# Patient Record
Sex: Male | Born: 1995
Health system: Southern US, Community
[De-identification: ages and names within clinical notes are randomized; demographics above are authoritative.]

## PROBLEM LIST (undated history)

## (undated) DIAGNOSIS — S43006A Unspecified dislocation of unspecified shoulder joint, initial encounter: Secondary | ICD-10-CM

---
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
## 2017-12-23 ENCOUNTER — Encounter (HOSPITAL_BASED_OUTPATIENT_CLINIC_OR_DEPARTMENT_OTHER): Payer: Self-pay

## 2017-12-26 ENCOUNTER — Encounter (HOSPITAL_BASED_OUTPATIENT_CLINIC_OR_DEPARTMENT_OTHER): Payer: Self-pay

## 2017-12-26 ENCOUNTER — Other Ambulatory Visit: Payer: Self-pay

## 2017-12-26 NOTE — Progress Notes (Signed)
Spoke with Dr. Aundria Rud via telephone he will put orders in today.

## 2017-12-26 NOTE — Progress Notes (Signed)
Spoke with:  Darroll NPO:  After Midnight, no gum, candy, or mints   Arrival time: 1030AM Labs: N/A AM medications:  None Pre op orders: No, LMOM for [facility] 12/26/2017 at 1605 Ride home:  Elayne Guerin (dad) [phone]

## 2017-12-27 ENCOUNTER — Encounter (HOSPITAL_BASED_OUTPATIENT_CLINIC_OR_DEPARTMENT_OTHER): Payer: Self-pay

## 2017-12-27 ENCOUNTER — Ambulatory Visit (HOSPITAL_BASED_OUTPATIENT_CLINIC_OR_DEPARTMENT_OTHER): Payer: Medicaid Other | Admitting: Certified Registered Nurse Anesthetist

## 2017-12-27 ENCOUNTER — Ambulatory Visit (HOSPITAL_BASED_OUTPATIENT_CLINIC_OR_DEPARTMENT_OTHER)
Admission: RE | Admit: 2017-12-27 | Discharge: 2017-12-27 | Disposition: A | Discharge status: Home / Self Care | Payer: Medicaid Other | Source: Ambulatory Visit | Attending: Orthopedic Surgery | Admitting: Orthopedic Surgery

## 2017-12-27 ENCOUNTER — Encounter (HOSPITAL_BASED_OUTPATIENT_CLINIC_OR_DEPARTMENT_OTHER)
Admission: RE | Disposition: A | Discharge status: Home / Self Care | Payer: Self-pay | Source: Ambulatory Visit | Attending: Orthopedic Surgery

## 2017-12-27 DIAGNOSIS — M25311 Other instability, right shoulder: Secondary | ICD-10-CM | POA: Diagnosis present

## 2017-12-27 HISTORY — PX: SHOULDER ARTHROSCOPY WITH LABRAL REPAIR: SHX5691

## 2017-12-27 HISTORY — DX: Unspecified dislocation of unspecified shoulder joint, initial encounter: S43.006A

## 2017-12-27 SURGERY — ARTHROSCOPY, SHOULDER, WITH GLENOID LABRUM REPAIR
Anesthesia: General | Laterality: Right

## 2017-12-27 MED ORDER — CEFAZOLIN SODIUM-DEXTROSE 2-4 GM/100ML-% IV SOLN
INTRAVENOUS | Status: AC
  Filled 2017-12-27: qty 100

## 2017-12-27 MED ORDER — DEXAMETHASONE SODIUM PHOSPHATE 4 MG/ML IJ SOLN
INTRAMUSCULAR | Status: DC | PRN
  Administered 2017-12-27: 4 mg via INTRAVENOUS

## 2017-12-27 MED ORDER — ROPIVACAINE HCL 7.5 MG/ML IJ SOLN
INTRAMUSCULAR | Status: DC | PRN
  Administered 2017-12-27: 20 mL via PERINEURAL

## 2017-12-27 MED ORDER — PROMETHAZINE HCL 25 MG/ML IJ SOLN
6.2500 mg | INTRAMUSCULAR | Status: DC | PRN
  Filled 2017-12-27: qty 1

## 2017-12-27 MED ORDER — FENTANYL CITRATE (PF) 100 MCG/2ML IJ SOLN
INTRAMUSCULAR | Status: AC
  Filled 2017-12-27: qty 2

## 2017-12-27 MED ORDER — LIDOCAINE 2% (20 MG/ML) 5 ML SYRINGE
INTRAMUSCULAR | Status: AC
  Filled 2017-12-27: qty 5

## 2017-12-27 MED ORDER — ONDANSETRON HCL 4 MG/2ML IJ SOLN
INTRAMUSCULAR | Status: AC
  Filled 2017-12-27: qty 2

## 2017-12-27 MED ORDER — MIDAZOLAM HCL 2 MG/2ML IJ SOLN
0.5000 mg | Freq: Once | INTRAMUSCULAR | Status: DC | PRN
  Filled 2017-12-27: qty 2

## 2017-12-27 MED ORDER — KETOROLAC TROMETHAMINE 30 MG/ML IJ SOLN
INTRAMUSCULAR | Status: AC
  Filled 2017-12-27: qty 1

## 2017-12-27 MED ORDER — CHLORHEXIDINE GLUCONATE 4 % EX LIQD
60.0000 mL | Freq: Once | CUTANEOUS | Status: DC
  Filled 2017-12-27: qty 118

## 2017-12-27 MED ORDER — ONDANSETRON 4 MG PO TBDP: 4.0000 mg | ORAL_TABLET | Freq: Three times a day (TID) | ORAL | 0 refills | Status: DC | PRN

## 2017-12-27 MED ORDER — MIDAZOLAM HCL 2 MG/2ML IJ SOLN
INTRAMUSCULAR | Status: AC
  Filled 2017-12-27: qty 2

## 2017-12-27 MED ORDER — PROPOFOL 10 MG/ML IV BOLUS
INTRAVENOUS | Status: DC | PRN
  Administered 2017-12-27: 200 mg via INTRAVENOUS

## 2017-12-27 MED ORDER — DEXAMETHASONE SODIUM PHOSPHATE 10 MG/ML IJ SOLN
INTRAMUSCULAR | Status: AC
  Filled 2017-12-27: qty 1

## 2017-12-27 MED ORDER — HYDROMORPHONE HCL 1 MG/ML IJ SOLN
0.2500 mg | INTRAMUSCULAR | Status: DC | PRN
  Filled 2017-12-27: qty 0.5

## 2017-12-27 MED ORDER — CEFAZOLIN SODIUM-DEXTROSE 2-4 GM/100ML-% IV SOLN
2.0000 g | INTRAVENOUS | Status: AC
  Administered 2017-12-27: 2 g via INTRAVENOUS
  Filled 2017-12-27: qty 100

## 2017-12-27 MED ORDER — MEPERIDINE HCL 25 MG/ML IJ SOLN
6.2500 mg | INTRAMUSCULAR | Status: DC | PRN
  Filled 2017-12-27: qty 1

## 2017-12-27 MED ORDER — ONDANSETRON HCL 4 MG/2ML IJ SOLN
INTRAMUSCULAR | Status: DC | PRN
  Administered 2017-12-27: 4 mg via INTRAVENOUS

## 2017-12-27 MED ORDER — PROPOFOL 10 MG/ML IV BOLUS
INTRAVENOUS | Status: AC
  Filled 2017-12-27: qty 20

## 2017-12-27 MED ORDER — OXYCODONE HCL 5 MG PO TABS: 5.0000 mg | ORAL_TABLET | ORAL | 0 refills | Status: AC | PRN

## 2017-12-27 MED ORDER — FENTANYL CITRATE (PF) 100 MCG/2ML IJ SOLN
100.0000 ug | Freq: Once | INTRAMUSCULAR | Status: AC
Start: 2017-12-27 — End: 2017-12-27
  Administered 2017-12-27: 100 ug via INTRAVENOUS
  Filled 2017-12-27: qty 2

## 2017-12-27 MED ORDER — LIDOCAINE HCL (CARDIAC) PF 100 MG/5ML IV SOSY
PREFILLED_SYRINGE | INTRAVENOUS | Status: DC | PRN
  Administered 2017-12-27: 60 mg via INTRAVENOUS

## 2017-12-27 MED ORDER — LACTATED RINGERS IV SOLN
INTRAVENOUS | Status: DC
  Administered 2017-12-27: 14:00:00 via INTRAVENOUS
  Administered 2017-12-27: 1000 mL via INTRAVENOUS
  Filled 2017-12-27: qty 1000

## 2017-12-27 MED ORDER — MIDAZOLAM HCL 2 MG/2ML IJ SOLN
2.0000 mg | Freq: Once | INTRAMUSCULAR | Status: AC
  Administered 2017-12-27: 2 mg via INTRAVENOUS
  Filled 2017-12-27: qty 2

## 2017-12-27 SURGICAL SUPPLY — 105 items
45 curve left quickpass lasso ×2 IMPLANT
45 degree right quickpass lasso ×2 IMPLANT
ANCH SUT SHRT 12.5 CANN EYLT (Anchor) ×4 IMPLANT
ANCHOR SUT BIOCOMP LK 2.9X12.5 (Anchor) ×8 IMPLANT
BLADE CUDA GRT WHITE 3.5 (BLADE) IMPLANT
BLADE CUDA SHAVER 3.5 (BLADE) ×1 IMPLANT
BLADE CUTTER GATOR 3.5 (BLADE) IMPLANT
BLADE GREAT WHITE 4.2 (BLADE) IMPLANT
BLADE GREAT WHITE 4.2MM (BLADE)
BLADE SURG 11 STRL SS (BLADE) ×3 IMPLANT
BLADE SURG 15 STRL LF DISP TIS (BLADE) ×1 IMPLANT
BLADE SURG 15 STRL SS (BLADE) ×3
BUR OVAL 4.0 (BURR) IMPLANT
BUR OVAL 6.0 (BURR) IMPLANT
BUR VERTEX HOODED 4.5 (BURR) IMPLANT
CANNULA 5.75X7 CRYSTAL CLEAR (CANNULA) ×3 IMPLANT
CANNULA 5.75X71 LONG (CANNULA) IMPLANT
CANNULA TWIST IN 8.25X7CM (CANNULA) ×2 IMPLANT
COVER MAYO STAND STRL (DRAPES) ×3 IMPLANT
DRAPE LG THREE QUARTER DISP (DRAPES) ×3 IMPLANT
DRAPE ORTHO SPLIT 77X108 STRL (DRAPES) ×6
DRAPE POUCH INSTRU U-SHP 10X18 (DRAPES) ×3 IMPLANT
DRAPE STERI 35X30 U-POUCH (DRAPES) ×3 IMPLANT
DRAPE SURG 17X23 STRL (DRAPES) ×3 IMPLANT
DRAPE SURG ORHT 6 SPLT 77X108 (DRAPES) ×2 IMPLANT
DRAPE U-SHAPE 47X51 STRL (DRAPES) ×3 IMPLANT
DRSG PAD ABDOMINAL 8X10 ST (GAUZE/BANDAGES/DRESSINGS) ×3 IMPLANT
DURAPREP 26ML APPLICATOR (WOUND CARE) ×3 IMPLANT
ELECT MENISCUS 165MM 90D (ELECTRODE) IMPLANT
ELECT REM PT RETURN 9FT ADLT (ELECTROSURGICAL)
ELECTRODE REM PT RTRN 9FT ADLT (ELECTROSURGICAL) IMPLANT
FIBERSTICK 2 (SUTURE) IMPLANT
GAUZE SPONGE 4X4 12PLY STRL (GAUZE/BANDAGES/DRESSINGS) ×3 IMPLANT
GAUZE SPONGE 4X4 12PLY STRL LF (GAUZE/BANDAGES/DRESSINGS) ×2 IMPLANT
GAUZE XEROFORM 1X8 LF (GAUZE/BANDAGES/DRESSINGS) ×3 IMPLANT
GLOVE BIO SURGEON STRL SZ7.5 (GLOVE) ×3 IMPLANT
GLOVE BIOGEL PI IND STRL 8 (GLOVE) ×1 IMPLANT
GLOVE BIOGEL PI INDICATOR 8 (GLOVE) ×2
GOWN STRL REUS W/TWL LRG LVL3 (GOWN DISPOSABLE) ×3 IMPLANT
IV NS IRRIG 3000ML ARTHROMATIC (IV SOLUTION) ×14 IMPLANT
KIT INSERTION 2.9 PUSHLOCK (KITS) ×2 IMPLANT
KIT TURNOVER CYSTO (KITS) ×3 IMPLANT
LASSO 45 DEG LEFT QUICKPASS (SUTURE) ×2 IMPLANT
LASSO QUICK PASS 45DEG CVD RT (SUTURE) ×2 IMPLANT
LASSO SUT 90 DEGREE (SUTURE) IMPLANT
LOOP 2 FIBERLINK CLOSED (SUTURE) IMPLANT
MANIFOLD NEPTUNE II (INSTRUMENTS) ×3 IMPLANT
NDL 1/2 CIR CATGUT .05X1.09 (NEEDLE) IMPLANT
NDL FILTER BLUNT 18X1 1/2 (NEEDLE) ×1 IMPLANT
NDL SAFETY ECLIPSE 18X1.5 (NEEDLE) ×1 IMPLANT
NDL SCORPION MULTI FIRE (NEEDLE) IMPLANT
NEEDLE 1/2 CIR CATGUT .05X1.09 (NEEDLE) IMPLANT
NEEDLE FILTER BLUNT 18X 1/2SAF (NEEDLE)
NEEDLE FILTER BLUNT 18X1 1/2 (NEEDLE) IMPLANT
NEEDLE HYPO 18GX1.5 SHARP (NEEDLE) ×3
NEEDLE SCORPION MULTI FIRE (NEEDLE) IMPLANT
NS IRRIG 500ML POUR BTL (IV SOLUTION) IMPLANT
PACK ARTHROSCOPY DSU (CUSTOM PROCEDURE TRAY) ×3 IMPLANT
PACK BASIN DAY SURGERY FS (CUSTOM PROCEDURE TRAY) ×3 IMPLANT
PAD ABD 8X10 STRL (GAUZE/BANDAGES/DRESSINGS) ×2 IMPLANT
PAD ARMBOARD 7.5X6 YLW CONV (MISCELLANEOUS) ×2 IMPLANT
PENCIL BUTTON HOLSTER BLD 10FT (ELECTRODE) IMPLANT
PORT APPOLLO RF 90DEGREE MULTI (SURGICAL WAND) ×2 IMPLANT
PROBE BIPOLAR ATHRO 135MM 90D (MISCELLANEOUS) ×1 IMPLANT
SET ARTHROSCOPY TUBING (MISCELLANEOUS) ×3
SET ARTHROSCOPY TUBING PVC (MISCELLANEOUS) ×1 IMPLANT
SHAVER 4.2 MM LANZA 9391A (BLADE) IMPLANT
SLEEVE ARM SUSPENSION SYSTEM (MISCELLANEOUS) ×3 IMPLANT
SLING S3 LATERAL DISP (MISCELLANEOUS) ×1 IMPLANT
SLING ULTRA II AB L (ORTHOPEDIC SUPPLIES) IMPLANT
SLING ULTRA II AB S (ORTHOPEDIC SUPPLIES) IMPLANT
SLING ULTRA II L (ORTHOPEDIC SUPPLIES) ×2 IMPLANT
SPONGE LAP 4X18 X RAY DECT (DISPOSABLE) IMPLANT
SUCTION FRAZIER HANDLE 10FR (MISCELLANEOUS)
SUCTION TUBE FRAZIER 10FR DISP (MISCELLANEOUS) IMPLANT
SUT 2 FIBERLOOP 20 STRT BLUE (SUTURE)
SUT ETHILON 3 0 PS 1 (SUTURE) IMPLANT
SUT FIBERWIRE #2 38 T-5 BLUE (SUTURE)
SUT LASSO 45 DEGREE LEFT (SUTURE) IMPLANT
SUT LASSO 45D RIGHT (SUTURE) IMPLANT
SUT MNCRL AB 3-0 PS2 18 (SUTURE) ×2 IMPLANT
SUT MNCRL AB 3-0 PS2 27 (SUTURE) ×3 IMPLANT
SUT PDS AB 0 CT1 36 (SUTURE) IMPLANT
SUT TIGER TAPE 7 IN WHITE (SUTURE) IMPLANT
SUT VIC AB 0 CT1 36 (SUTURE) IMPLANT
SUT VIC AB 2-0 CT1 27 (SUTURE)
SUT VIC AB 2-0 CT1 TAPERPNT 27 (SUTURE) IMPLANT
SUTURE 2 FIBERLOOP 20 STRT BLU (SUTURE) IMPLANT
SUTURE FIBERWR #2 38 T-5 BLUE (SUTURE) IMPLANT
SUTURE TAPE TIGERLINK 1.3MM BL (SUTURE) IMPLANT
SUTURETAPE TIGERLINK 1.3MM BL (SUTURE) ×3
SYR CONTROL 10ML LL (SYRINGE) IMPLANT
SYR TB 1ML LL NO SAFETY (SYRINGE) ×1 IMPLANT
TAPE CLOTH SURG 4X10 WHT LF (GAUZE/BANDAGES/DRESSINGS) ×2 IMPLANT
TAPE CLOTH SURG 6X10 WHT LF (GAUZE/BANDAGES/DRESSINGS) ×3 IMPLANT
TAPE STRIPS DRAPE STRL (GAUZE/BANDAGES/DRESSINGS) ×2 IMPLANT
TOWEL OR 17X24 6PK STRL BLUE (TOWEL DISPOSABLE) ×3 IMPLANT
TUBE CONNECTING 12'X1/4 (SUCTIONS) ×3
TUBE CONNECTING 12X1/4 (SUCTIONS) ×5 IMPLANT
TUBING ARTHROSCOPY IRRIG 16FT (MISCELLANEOUS) ×2 IMPLANT
WATER STERILE IRR 500ML POUR (IV SOLUTION) ×3 IMPLANT
YANKAUER SUCT BULB TIP NO VENT (SUCTIONS) IMPLANT
arthroscopy pump tubing ×2 IMPLANT
perc insertion kit 2.9 pushlock ×2 IMPLANT
tigerlink 1.3 m suture tape ×8 IMPLANT

## 2017-12-27 NOTE — H&P (Signed)
ORTHOPAEDIC H and P  REQUESTING PHYSICIAN: Yolonda Kida, MD  PCP:  System, Pcp Not In  Chief Complaint: Right shoulder recurrent instability.  HPI: Keith Rhodes is a 22 y.o. male who complains of right shoulder instability and pain.  He has had multiple dislocations over the past year and a half.  He presents today for arthroscopic stabilization.  We have counseled him as to the indications for that surgery as he has failed nonoperative management.  He has no new complaints at this time other than being ready to have the shoulder fixed.  Past Medical History:  Diagnosis Date  . Shoulder dislocation    Rightx4   History reviewed. No pertinent surgical history. Social History   Socioeconomic History  . Marital status: Single    Spouse name: Not on file  . Number of children: Not on file  . Years of education: Not on file  . Highest education level: Not on file  Occupational History  . Not on file  Social Needs  . Financial resource strain: Not on file  . Food insecurity:    Worry: Not on file    Inability: Not on file  . Transportation needs:    Medical: Not on file    Non-medical: Not on file  Tobacco Use  . Smoking status: Never Smoker  . Smokeless tobacco: Never Used  Substance and Sexual Activity  . Alcohol use: No  . Drug use: Never  . Sexual activity: Not on file  Lifestyle  . Physical activity:    Days per week: Not on file    Minutes per session: Not on file  . Stress: Not on file  Relationships  . Social connections:    Talks on phone: Not on file    Gets together: Not on file    Attends religious service: Not on file    Active member of club or organization: Not on file    Attends meetings of clubs or organizations: Not on file    Relationship status: Not on file  Other Topics Concern  . Not on file  Social History Narrative  . Not on file   History reviewed. No pertinent family history. No Known Allergies Prior to Admission  medications   Medication Sig Start Date End Date Taking? Authorizing Provider  acetaminophen (TYLENOL) 325 MG tablet Take 650 mg by mouth every 6 (six) hours as needed for mild pain.   Yes [provider]  OVER THE COUNTER MEDICATION Bone strength vitamins   Yes [provider]   No results found.  Positive ROS: All other systems have been reviewed and were otherwise negative with the exception of those mentioned in the HPI and as above.  Physical Exam: General: Alert, no acute distress Cardiovascular: No pedal edema Respiratory: No cyanosis, no use of accessory musculature GI: No organomegaly, abdomen is soft and non-tender Skin: No lesions in the area of chief complaint Neurologic: Sensation intact distally Psychiatric: Patient is competent for consent with normal mood and affect Lymphatic: No axillary or cervical lymphadenopathy    Assessment: Right shoulder instability with Bankart tear.  Plan: -Plan for arthroscopic stabilization with labral repair and possible SLAP repair.  We again reviewed the risks, benefits, and indications of that procedure at length.  He has provided informed consent. -We will plan for discharge home from PACU postoperatively.  He will have an interscalene block provided by the anesthesia team preop. -He will be in a shoulder sling with abduction pillow  postop for 1 month.    Yolonda Kida, MD Cell [phone]    12/27/2017 11:59 AM

## 2017-12-27 NOTE — Op Note (Signed)
12/27/2017   PATIENT:  Keith Rhodes    PRE-OPERATIVE DIAGNOSIS:  Right shoulder instability  POST-OPERATIVE DIAGNOSIS:  Same  PROCEDURE:  Arthroscopic right shoulder labrum repair (Bankart)  SURGEON:  Yolonda Kida, MD  Assistant:  Hillis Range, RNFA  ANESTHESIA:   General  ESTIMATED BLOOD LOSS: minimal  PREOPERATIVE INDICATIONS:  Keith Rhodes is a  22 y.o. male with a diagnosis of Right shoulder instability who failed conservative measures and elected for surgical management.  Prior to today's surgical intervention he has had no less than 6 anterior dislocations that required emergency department reductions.  The risks benefits and alternatives were discussed with the patient preoperatively including but not limited to the risks of infection, bleeding, nerve injury, cardiopulmonary complications, the need for revision surgery, among others, and the patient was willing to proceed.  OPERATIVE IMPLANTS: Arthrex bio composite 2.9 mm short push lock anchors x4. I used a total of 4 #2 labral fiber tapes,.   OPERATIVE FINDINGS:  On the diagnostic arthroscopy he was noted to have grade 0 chondromalacia of the humeral head and glenoid fossa.  He had a large anterior Bankart with an anterior labral tear that has extended from the 2 o'clock position on this right shoulder all the way to about 7:00.  The capsule was somewhat patulous.  There was a shallow Hill-Sachs lesion that was not engaging on the posterior humeral head.  The biceps tendon was erythematous but there was no SLAP pathology noted.  The rotator cuff was intact 4.  There were no loose bodies.    UNIQUE ASPECTS OF THE CASE:   The inferior anterior labral tissue was very poor in quality.  The capsular quality was fair to good.  OPERATIVE PROCEDURE: The patient was brought to the operating room and placed in the supine position. General anesthesia was administered. IV antibiotics were given. General  anesthesia was administered.   The upper extremity was examined and found to be grossly unstable particularly to anterior testing. There were no limitations in range of motion however.The upper extremity was prepped and draped in the usual sterile fashion. The patient was in a semilateral decubitus position.  Time out was performed. Diagnostic arthroscopy was carried out the above-named findings.   I placed 2 anterior cannulas, one just off the superior boarder of the subscapularis and one in the superolateral aspect of the rotator interval utilizing spinal needle localization, and then mobilized the labrum off of the medial neck of the glenoid with the spatula.  I then prepared the neck of the glenoid with a shaver/rasp to optimize healing, while still preserving the anterior bone stock.  The labrum had excellent mobility.   I began the stabilization by addressing the 7:00 labral nondisplaced tear.  I placed a 7:00 portal percutaneously and utilized the 45 lasso to perform a capsular labral repair at the 7 o'clock position.  A fiber tape was passed to that tissue and placed into a 2.9 mm push lock anchor.  We next turned our attention to the anterior labral repair.  I then used a suture passer to pass an inverted labral fiber tape on either side of the inferior anterior glenohumeral ligament.  Care was taken to perform superior and lateral shift of the capsulolabral tissue to restore the labrum bumper.  This had excellent purchase on the tissue. Each of this was secured into the glenoid by placing a 2.9 mm anchor by first drilling and then securing the anchor with mallet.  One  anchor was placed at 5 o'clock and the second at 4 o'clock on the glenoid face.  I then placed a another suture slightly superiorly.  This was anchored around the 3:00 o'clock position using a push lock.   Excellent soft tissue restoration of tension was achieved, restoring the labrum bumper, and the humeral head was noted to be  centered on the glenoi , and the arthroscopic cannulas were removed, and the portals closed with Monocryl followed by Steri-Strips and sterile gauze. The patient was awakened and returned to the PACU in stable and satisfactory condition. There were no complications and the patient tolerated the procedure well.  All counts were correct.  No complications were noted and the patient was transferred to the PACU in stable condition.  Disposition:  The patient will be nonweightbearing with an abduction sling to the operative extremity.  He may begin scapular retractions and elbow hand and wrist range motion as tolerated.  He will begin physical therapy in 1 week.  I will see them back in the office in 2 weeks for a wound check.

## 2017-12-27 NOTE — Discharge Instructions (Signed)
Ortho Instructions:  - Maintain your sling at all times, unless showering or doing daily pendulum exercise - No lifting with the right arm - you may remove your post op bandages in 3 days and begin showering at that time, then pat your wounds dry and cover with band aids. - for mild to moderate pain use tylenol and or ibuprofen as needed and for severe pain use oxycodone as directed. - return to see Dr. Aundria Rud in 2 weeks.  Call your surgeon if you experience:   1.  Fever over 101.0. 2.  Inability to urinate. 3.  Nausea and/or vomiting. 4.  Extreme swelling or bruising at the surgical site. 5.  Continued bleeding from the incision. 6.  Increased pain, redness or drainage from the incision. 7.  Problems related to your pain medication. 8.  Any problems and/or concerns   Post Anesthesia Home Care Instructions  Activity: Get plenty of rest for the remainder of the day. A responsible individual must stay with you for 24 hours following the procedure.  For the next 24 hours, DO NOT: -Drive a car -Advertising copywriter -Drink alcoholic beverages -Take any medication unless instructed by your physician -Make any legal decisions or sign important papers.  Meals: Start with liquid foods such as gelatin or soup. Progress to regular foods as tolerated. Avoid greasy, spicy, heavy foods. If nausea and/or vomiting occur, drink only clear liquids until the nausea and/or vomiting subsides. Call your physician if vomiting continues.  Special Instructions/Symptoms: Your throat may feel dry or sore from the anesthesia or the breathing tube placed in your throat during surgery. If this causes discomfort, gargle with warm salt water. The discomfort should disappear within 24 hours.

## 2017-12-27 NOTE — Brief Op Note (Signed)
12/27/2017  2:48 PM  PATIENT:  Keith Rhodes  22 y.o. male  PRE-OPERATIVE DIAGNOSIS:  Right shoulder instability  POST-OPERATIVE DIAGNOSIS:  Right shoulder instability  PROCEDURE:  Procedure(s): Arthroscopic right shoulder labrum repair (Right)  SURGEON:  Surgeon(s) and Role:    * Yolonda Kida, MD - Primary  PHYSICIAN ASSISTANT:   ASSISTANTS: Hillis Range, RNFA   ANESTHESIA:   regional and general  EBL:  minimal  BLOOD ADMINISTERED:none  DRAINS: none   LOCAL MEDICATIONS USED:  MARCAINE     SPECIMEN:  No Specimen  DISPOSITION OF SPECIMEN:  N/A  COUNTS:  YES  TOURNIQUET:  * No tourniquets in log *  DICTATION: .Note written in EPIC  PLAN OF CARE: Discharge to home after PACU  PATIENT DISPOSITION:  PACU - hemodynamically stable.   Delay start of Pharmacological VTE agent (>24hrs) due to surgical blood loss or risk of bleeding: not applicable

## 2017-12-27 NOTE — Anesthesia Procedure Notes (Signed)
Anesthesia Regional Block: Interscalene brachial plexus block   Pre-Anesthetic Checklist: ,, timeout performed, Correct Patient, Correct Site, Correct Laterality, Correct Procedure, Correct Position, site marked, Risks and benefits discussed,  Surgical consent,  Pre-op evaluation,  At surgeon's request and post-op pain management  Laterality: Right and Upper  Prep: chloraprep       Needles:   Needle Type: Echogenic Needle     Needle Length: 9cm  Needle Gauge: 21     Additional Needles:   Procedures:, nerve stimulator,,, ultrasound used (permanent image in chart),,,,   Nerve Stimulator or Paresthesia:  Response: hand twitch, 0.4 mA, 0.1 ms,   Additional Responses:   Narrative:  Start time: 12/27/2017 12:15 PM End time: 12/27/2017 12:24 PM Injection made incrementally with aspirations every 5 mL.  Performed by: Personally  Anesthesiologist: Jairo Ben, MD  Additional Notes: Pt identified in Holding room.  Monitors applied. Working IV access confirmed. Sterile prep.  #21ga ECHOgenic PNS to hand twitch at 0.45mA threshold, with US guidance.  20cc 0.75% Ropivacaine injected incrementally after negative test dose.  Patient asymptomatic, VSS, no heme aspirated, tolerated well.  Sandford Craze, MD

## 2017-12-27 NOTE — Anesthesia Procedure Notes (Signed)
Procedure Name: LMA Insertion Date/Time: 12/27/2017 12:47 PM Performed by: Cleda Clarks, CRNA Pre-anesthesia Checklist: Patient identified, Emergency Drugs available, Suction available and Patient being monitored Patient Re-evaluated:Patient Re-evaluated prior to induction Oxygen Delivery Method: Circle system utilized Preoxygenation: Pre-oxygenation with 100% oxygen Induction Type: IV induction Ventilation: Mask ventilation without difficulty LMA: LMA inserted LMA Size: 4.0 Number of attempts: 1 Airway Equipment and Method: Bite block Placement Confirmation: positive ETCO2 Tube secured with: Tape Dental Injury: Teeth and Oropharynx as per pre-operative assessment

## 2017-12-27 NOTE — Transfer of Care (Signed)
Immediate Anesthesia Transfer of Care Note  Patient: Keith Rhodes  Procedure(s) Performed: Arthroscopic right shoulder labrum repair (Right )  Patient Location: PACU  Anesthesia Type:General  Level of Consciousness: awake, alert  and oriented  Airway & Oxygen Therapy: Patient Spontanous Breathing and Patient connected to nasal cannula oxygen  Post-op Assessment: Report given to RN and Post -op Vital signs reviewed and stable  Post vital signs: Reviewed and stable  Last Vitals:  Vitals Value Taken Time  BP 110/65 12/27/2017  3:04 PM  Temp 36.4 C 12/27/2017  3:04 PM  Pulse 66 12/27/2017  3:07 PM  Resp 10 12/27/2017  3:07 PM  SpO2 100 % 12/27/2017  3:07 PM  Vitals shown include unvalidated device data.  Last Pain:  Vitals:   12/27/17 1138  TempSrc:   PainSc: 0-No pain      Patients Stated Pain Goal: 6 (12/27/17 1138)  Complications: No apparent anesthesia complications

## 2017-12-27 NOTE — Anesthesia Preprocedure Evaluation (Addendum)
Anesthesia Evaluation  Patient identified by MRN, date of birth, ID band Patient awake    Reviewed: Allergy & Precautions, NPO status , Patient's Chart, lab work & pertinent test results  History of Anesthesia Complications Negative for: history of anesthetic complications  Airway Mallampati: II  TM Distance: >3 FB Neck ROM: Full    Dental  (+) Dental Advisory Given   Pulmonary neg pulmonary ROS,    breath sounds clear to auscultation       Cardiovascular negative cardio ROS   Rhythm:Regular Rate:Normal     Neuro/Psych negative neurological ROS     GI/Hepatic negative GI ROS, Neg liver ROS,   Endo/Other  negative endocrine ROS  Renal/GU negative Renal ROS     Musculoskeletal   Abdominal   Peds  Hematology negative hematology ROS (+)   Anesthesia Other Findings   Reproductive/Obstetrics                             Anesthesia Physical Anesthesia Plan  ASA: I  Anesthesia Plan: General   Post-op Pain Management:    Induction: Intravenous  PONV Risk Score and Plan: 2 and Ondansetron and Dexamethasone  Airway Management Planned: LMA  Additional Equipment:   Intra-op Plan:   Post-operative Plan: Extubation in OR  Informed Consent: I have reviewed the patients History and Physical, chart, labs and discussed the procedure including the risks, benefits and alternatives for the proposed anesthesia with the patient or authorized representative who has indicated his/her understanding and acceptance.   Dental advisory given  Plan Discussed with: CRNA and Surgeon  Anesthesia Plan Comments: (Plan routine monitors, GA-LMA OK, with interscalene block for post op analgesia)       Anesthesia Quick Evaluation

## 2017-12-28 ENCOUNTER — Encounter (HOSPITAL_BASED_OUTPATIENT_CLINIC_OR_DEPARTMENT_OTHER): Payer: Self-pay | Admitting: Orthopedic Surgery

## 2017-12-28 NOTE — Anesthesia Postprocedure Evaluation (Signed)
Anesthesia Post Note  Patient: Keith Rhodes  Procedure(s) Performed: Arthroscopic right shoulder labrum repair (Right )     Patient location during evaluation: PACU Anesthesia Type: General Level of consciousness: sedated and patient cooperative Pain management: pain level controlled Vital Signs Assessment: post-procedure vital signs reviewed and stable Respiratory status: spontaneous breathing Cardiovascular status: stable Anesthetic complications: no    Last Vitals:  Vitals:   12/27/17 1530 12/27/17 1609  BP: 121/77 125/73  Pulse: 71 75  Resp: 13 14  Temp:  36.6 C  SpO2: 97% 96%    Last Pain:  Vitals:   12/28/17 1416  TempSrc:   PainSc: 2                  Keith Rhodes

## 2018-01-30 ENCOUNTER — Other Ambulatory Visit: Payer: Self-pay

## 2018-01-30 ENCOUNTER — Ambulatory Visit: Payer: Medicaid Other | Attending: Orthopedic Surgery | Admitting: Rehabilitative and Restorative Service Providers"

## 2018-01-30 ENCOUNTER — Encounter: Payer: Self-pay | Admitting: Rehabilitative and Restorative Service Providers"

## 2018-01-30 DIAGNOSIS — G8929 Other chronic pain: Secondary | ICD-10-CM | POA: Diagnosis present

## 2018-01-30 DIAGNOSIS — R293 Abnormal posture: Secondary | ICD-10-CM | POA: Insufficient documentation

## 2018-01-30 DIAGNOSIS — M25319 Other instability, unspecified shoulder: Secondary | ICD-10-CM | POA: Insufficient documentation

## 2018-01-30 DIAGNOSIS — M6281 Muscle weakness (generalized): Secondary | ICD-10-CM | POA: Insufficient documentation

## 2018-01-30 DIAGNOSIS — M25511 Pain in right shoulder: Secondary | ICD-10-CM | POA: Diagnosis present

## 2018-01-30 NOTE — Patient Instructions (Signed)
HEP issued for gentle scapula retraction with sling on; small movement not visible to others. Icing instructions for 2-3x/day 15 minutes; discussed he needs to discontinue using R UE in and out of sling and abide by MD precautions. Pt reported agreement.

## 2018-01-30 NOTE — Therapy (Signed)
[facility] Outpatient Rehabilitation [facility] [address] [place], [place], [address] Phone: [phone]   Fax:  [phone]  Physical Therapy Evaluation  Patient Details  Name: Keith Rhodes MRN: [id number] Date of Birth: 08/22/1995 Referring Provider: Duwayne Heck, MD   Encounter Date: 01/30/2018  PT End of Session - 01/30/18 1750    Visit Number  1    Number of Visits  12    Date for PT Re-Evaluation  03/13/18    Authorization Type  MCD    PT Start Time  0300    PT Stop Time  0337    PT Time Calculation (min)  37 min    Activity Tolerance  Patient tolerated treatment well;No increased pain    Behavior During Therapy  WFL for tasks assessed/performed       Past Medical History:  Diagnosis Date  . Shoulder dislocation    Rightx4    Past Surgical History:  Procedure Laterality Date  . SHOULDER ARTHROSCOPY WITH LABRAL REPAIR Right 12/27/2017   Procedure: Arthroscopic right shoulder labrum repair;  Surgeon: Yolonda Kida, MD;  Location: [facility];  Service: Orthopedics;  Laterality: Right;    There were no vitals filed for this visit.   Subjective Assessment - 01/30/18 1506    Subjective  Dislocated my R shoulder 4x. R labral repair 12/27/17 with hardware replacement. Supposed to wear sling 4-6 weeks. June 25 RTD. Pt has been using R arm to perform below shoulder level activities. MD notes state "Plan for arthroscopic stabilization with labral repair and possible SLAP repair"/Bankart repair. Pt reports he has noticed R shoulder/upper arm swelling but no increased pain.     Pertinent History  Dislocated R shoulder 4x. R labral repair 12/27/17 with hardware replacement. Supposed to wear sling 4-6 weeks. June 25 RTD. Very active in sports.    How long can you sit comfortably?  indeterminate time length; no issues    How long can you stand comfortably?  indeterminate time length; no issues    How long can you walk comfortably?   indeterminate time length; no issues    Patient Stated Goals  play basketball, boxing (would dislocate after basketball)    Currently in Pain?  No/denies    Multiple Pain Sites  No         [facility] PT Assessment - 01/30/18 0001      Assessment   Medical Diagnosis  R labral repair 12/27/17    Referring Provider  Duwayne Heck, MD    Onset Date/Surgical Date  12/27/17    Hand Dominance  Right    Next MD Visit  February 07, 2018    Prior Therapy  n/a      Precautions   Precautions  -- wear arm sling x 1 month postop    Precaution Comments  wear arm sling, can do pendulums, can do anything lower height      Balance Screen   Has the patient fallen in the past 6 months  No      Home Environment   Additional Comments  independent with all; not driving      Prior Function   Level of Independence  Independent      Cognition   Overall Cognitive Status  Within Functional Limits for tasks assessed    Attention  Focused    Memory  Appears intact      Observation/Other Assessments   Observations  -- souched posture; wants to hurry and heal  Skin Integrity  has 4 scars from surgery; 1 scab in each scar      Observation/Other Assessments-Edema    Edema  -- pt has moderate swelling from shoulder to mid humerus      Posture/Postural Control   Posture Comments  rounded shoulders, R scapula depressed; R Upper Trap tighter ; cervical AROM WFL      ROM / Strength   AROM / PROM / Strength  AROM      AROM   Overall AROM   Other (comment)    Overall AROM Comments  seated L ER T3, IR T4, flexion/abduction WNL; R flex 75, abdct 69 measured sitting; supine R IR 29, ER 10  with limitation gently felt with ER      Strength   Overall Strength Comments  L shoulder strength 4+ to 5/5; R unassessed due to restrictions of repair      Palpation   Palpation comment  R shoulder swollen; scapulae asymmetrical, elbow flex/ext WNL                Objective measurements completed on examination:  See above findings.                PT Short Term Goals - 01/30/18 1745      PT SHORT TERM GOAL #1   Title  He will be independent with inital HEP     Baseline  issued at eval    Time  3    Period  Weeks    Status  New      PT SHORT TERM GOAL #2   Title  Pt will perform R shoulder flex to 100 degrees seated.     Baseline  75 degrees    Time  3    Period  Weeks    Status  New      PT SHORT TERM GOAL #3   Title  Pt will be able to perform R shoulder abduction to 100 degrees to assist with showering    Baseline  69 degrees    Time  3    Period  Weeks    Status  New        PT Long Term Goals - 01/30/18 1747      PT LONG TERM GOAL #1   Title  He will be able to demo all HEP inclding advanced stabilization and strength without pain.     Baseline  initial HEP issued at eval    Time  6    Period  Weeks    Status  New      PT LONG TERM GOAL #2   Title  Pt will demo improved R UE strength to >/= 4+/5 to assist with ADLS and iADLS    Baseline  unable to be assessed at time of eval due to precautions and post-surgical state    Time  6    Period  Weeks    Status  New      PT LONG TERM GOAL #3   Title  Pt will be able to play basketball x 3 min once cleared by MD without pain    Baseline  Not playing sports    Time  6    Period  Weeks    Status  New      PT LONG TERM GOAL #4   Title  He will be able to tolerate body weight with plank / getting off floor using R arm without pain or compensation.  Baseline  unable    Time  6    Period  Weeks    Status  New             Plan - 01/30/18 1546    Clinical Impression Statement  Pt presents s/p R Bankart/labral repair 12/27/17. He has been performing pendulum exercises prescribed by MD. He also reports using his UE in and out of the sling. He denies pain. He presents with moderate swelling beginning at shoulder extending into mid-upper arm. Elbow flex/ext WNL. Pt would benefit from PT for shoulder ROM,  strengthening, and activity tolerance to assist with improving activity tolerance as he is active. He returns to MD 02/07/18.     History and Personal Factors relevant to plan of care:  s/p R labral/Bankart repair 12/27/17    Clinical Presentation  Stable    Clinical Presentation due to:  post-surgical pt    Clinical Decision Making  Low    Rehab Potential  Excellent    PT Frequency  2x / week    PT Duration  6 weeks    PT Treatment/Interventions  ADLs/Self Care Home Management;Neuromuscular re-education;Manual techniques;Functional mobility training;Moist Heat;Therapeutic activities;Patient/family education;Taping;Vasopneumatic Device;Therapeutic exercise;Compression bandaging;Electrical Stimulation;Scar mobilization;Passive range of motion    PT Next Visit Plan  Review HEP, see what MD says (if he can perform further activities). Further HEP was not issued due to pt already not abiding by MD precaution.    PT Home Exercise Plan  gentle scapula retraction bil with sling on R UE. Ice 15 min 2-3x/day    Consulted and Agree with Plan of Care  Patient       Patient will benefit from skilled therapeutic intervention in order to improve the following deficits and impairments:  Postural dysfunction, Decreased scar mobility, Decreased mobility, Decreased coordination, Decreased activity tolerance, Decreased endurance, Decreased range of motion, Decreased strength, Hypomobility, Impaired UE functional use, Impaired flexibility, Increased edema, Decreased safety awareness  Visit Diagnosis: Instability of shoulder joint, unspecified laterality - Plan: PT plan of care cert/re-cert  Abnormal posture - Plan: PT plan of care cert/re-cert  Chronic right shoulder pain - Plan: PT plan of care cert/re-cert  Muscle weakness (generalized) - Plan: PT plan of care cert/re-cert     Problem List Patient Active Problem List   Diagnosis Date Noted  . Shoulder joint instability, right 12/27/2017     Thornell Sartorius, PT 01/30/2018, 5:57 PM  [facility] [address] [place], [place], [address] Phone: [phone]   Fax:  [phone]  Name: Keith Rhodes MRN: [id number] Date of Birth: Dec 31, 1995

## 2018-02-10 ENCOUNTER — Encounter

## 2018-02-13 ENCOUNTER — Ambulatory Visit: Payer: Medicaid Other | Attending: Orthopedic Surgery | Admitting: Physical Therapy

## 2018-02-13 ENCOUNTER — Encounter: Payer: Self-pay | Admitting: Physical Therapy

## 2018-02-13 DIAGNOSIS — G8929 Other chronic pain: Secondary | ICD-10-CM

## 2018-02-13 DIAGNOSIS — R293 Abnormal posture: Secondary | ICD-10-CM

## 2018-02-13 DIAGNOSIS — M25511 Pain in right shoulder: Secondary | ICD-10-CM | POA: Insufficient documentation

## 2018-02-13 DIAGNOSIS — M6281 Muscle weakness (generalized): Secondary | ICD-10-CM

## 2018-02-13 DIAGNOSIS — M25319 Other instability, unspecified shoulder: Secondary | ICD-10-CM | POA: Insufficient documentation

## 2018-02-13 NOTE — Therapy (Signed)
[facility] Outpatient Rehabilitation [facility] [address] [place], [place], [address] Phone: [phone]   Fax:  [phone]  Physical Therapy Treatment  Patient Details  Name: Keith Rhodes MRN: [id number] Date of Birth: 1995-09-27 Referring Provider: Duwayne Heck, MD   Encounter Date: 02/13/2018  PT End of Session - 02/13/18 1536    Visit Number  2    Number of Visits  12    Date for PT Re-Evaluation  03/13/18    Authorization Type  MCD    PT Start Time  1523 arr late     PT Stop Time  1558    PT Time Calculation (min)  35 min    Activity Tolerance  Patient tolerated treatment well;No increased pain    Behavior During Therapy  WFL for tasks assessed/performed       Past Medical History:  Diagnosis Date  . Shoulder dislocation    Rightx4    Past Surgical History:  Procedure Laterality Date  . SHOULDER ARTHROSCOPY WITH LABRAL REPAIR Right 12/27/2017   Procedure: Arthroscopic right shoulder labrum repair;  Surgeon: Yolonda Kida, MD;  Location: [facility];  Service: Orthopedics;  Laterality: Right;    There were no vitals filed for this visit.  Subjective Assessment - 02/13/18 1531    Subjective  no pain at rest.  Pt arr late.      Currently in Pain?  No/denies                       [facility] Adult PT Treatment/Exercise - 02/13/18 0001      Shoulder Exercises: Supine   Horizontal ABduction  AAROM;Both;10 reps    External Rotation  AAROM;Right;10 reps    External Rotation Weight (lbs)  at 20 deg abd     Flexion  AAROM;Both;10 reps    Shoulder Flexion Weight (lbs)  2 sets 1 supine 1 standing     ABduction  AAROM;Right;10 reps    Shoulder ABduction Weight (lbs)  cane standing     Other Supine Exercises  chest press x 10 cane     Other Supine Exercises  extension gentle x 10 standing       Shoulder Exercises: Pulleys   Flexion  3 minutes      Cryotherapy   Number Minutes Cryotherapy  8 Minutes    Cryotherapy Location  Shoulder    Type of Cryotherapy  Ice pack      Manual Therapy   Passive ROM  all planes, briefly              PT Education - 02/13/18 1606    Education Details  HEP, protocol    Person(s) Educated  Patient    Methods  Explanation    Comprehension  Verbalized understanding       PT Short Term Goals - 02/13/18 1537      PT SHORT TERM GOAL #1   Title  He will be independent with inital HEP     Status  On-going      PT SHORT TERM GOAL #2   Title  Pt will perform R shoulder flex to 100 degrees seated.     Baseline  90    Status  On-going      PT SHORT TERM GOAL #3   Title  Pt will be able to perform R shoulder abduction to 100 degrees to assist with showering    Baseline  80    Status  On-going  PT Long Term Goals - 01/30/18 1747      PT LONG TERM GOAL #1   Title  He will be able to demo all HEP inclding advanced stabilization and strength without pain.     Baseline  initial HEP issued at eval    Time  6    Period  Weeks    Status  New      PT LONG TERM GOAL #2   Title  Pt will demo improved R UE strength to >/= 4+/5 to assist with ADLS and iADLS    Baseline  unable to be assessed at time of eval due to precautions and post-surgical state    Time  6    Period  Weeks    Status  New      PT LONG TERM GOAL #3   Title  Pt will be able to play basketball x 3 min once cleared by MD without pain    Baseline  Not playing sports    Time  6    Period  Weeks    Status  New      PT LONG TERM GOAL #4   Title  He will be able to tolerate body weight with plank / getting off floor using R arm without pain or compensation.     Baseline  unable    Time  6    Period  Weeks    Status  New            Plan - 02/13/18 1600    Clinical Impression Statement  Was seen today but arrived late.  Gave HEP for AAROM.  He is 6 weeks post and now weaned out of the sling.  Pain is min to none, increased today with exercises.  Avoid aggressive  stretching but he has had no PT up to this point so reinforce HEP.      PT Treatment/Interventions  ADLs/Self Care Home Management;Neuromuscular re-education;Manual techniques;Functional mobility training;Moist Heat;Therapeutic activities;Patient/family education;Taping;Vasopneumatic Device;Therapeutic exercise;Compression bandaging;Electrical Stimulation;Scar mobilization;Passive range of motion    PT Next Visit Plan  check HEP, AAROM use table, wall as well.  pulleys.  try isometrics     PT Home Exercise Plan  ice, pendulum, scap retract, cane for AAROM     Consulted and Agree with Plan of Care  Patient       Patient will benefit from skilled therapeutic intervention in order to improve the following deficits and impairments:  Postural dysfunction, Decreased scar mobility, Decreased mobility, Decreased coordination, Decreased activity tolerance, Decreased endurance, Decreased range of motion, Decreased strength, Hypomobility, Impaired UE functional use, Impaired flexibility, Increased edema, Decreased safety awareness  Visit Diagnosis: Instability of shoulder joint, unspecified laterality  Abnormal posture  Chronic right shoulder pain  Muscle weakness (generalized)     Problem List Patient Active Problem List   Diagnosis Date Noted  . Shoulder joint instability, right 12/27/2017    [name] 02/13/2018, 4:07 PM  [facility] [address] [place], [place], [address] Phone: [phone]   Fax:  [phone]  Name: Keith Rhodes MRN: [id number] Date of Birth: [birth year]  Karie MainlandJennifer Kaneshia Cater, PT 02/13/18 4:07 PM Phone: [phone] Fax: [phone]

## 2018-02-13 NOTE — Patient Instructions (Signed)
Access Code: [email or web address]  URL: [email or web address]  Date: 02/13/2018  Prepared by: Karie MainlandJennifer Violeta Lecount   Exercises  Supine Shoulder External Rotation with Dowel - 10 reps - 2 sets - 10 hold - 2x daily - 7x weekly  Supine Shoulder Flexion with Dowel - 10 reps - 2 sets - 10 hold - 2x daily - 7x weekly  Standing Shoulder Extension with Dowel - 10 reps - 2 sets - 10 hold - 2x daily - 7x weekly  Supine Shoulder Press with Dowel - 10 reps - 2 sets - 10 hold - 2x daily - 7x weekly

## 2018-02-14 ENCOUNTER — Ambulatory Visit: Payer: Medicaid Other | Admitting: Physical Therapy

## 2018-02-14 ENCOUNTER — Encounter: Payer: Self-pay | Admitting: Physical Therapy

## 2018-02-14 DIAGNOSIS — M25319 Other instability, unspecified shoulder: Secondary | ICD-10-CM | POA: Diagnosis not present

## 2018-02-14 DIAGNOSIS — R293 Abnormal posture: Secondary | ICD-10-CM

## 2018-02-14 DIAGNOSIS — M25511 Pain in right shoulder: Secondary | ICD-10-CM

## 2018-02-14 DIAGNOSIS — G8929 Other chronic pain: Secondary | ICD-10-CM | POA: Diagnosis present

## 2018-02-14 DIAGNOSIS — M6281 Muscle weakness (generalized): Secondary | ICD-10-CM | POA: Diagnosis present

## 2018-02-14 NOTE — Patient Instructions (Signed)
Strengthening: Isometric Flexion  Using wall for resistance, press right fist into ball using light pressure. Hold _5___ seconds. Repeat _10___ times per set. Do __1__ sets per session. Do ___2_ sessions per day.    Extension (Isometric)  Place left bent elbow and back of arm against wall. Press elbow against wall. Hold ___5_ seconds. Repeat __10__ times. Do __2__ sessions per day.  Internal Rotation (Isometric)  Place palm of right fist against door frame, with elbow bent. Press fist against door frame. Hold __5__ seconds. Repeat __10__ times. Do ___2_ sessions per day.  External Rotation (Isometric)  Place back of left fist against door frame, with elbow bent. Press fist against door frame. Hold __5__ seconds. Repeat _10___ times. Do __2__ sessions per day.  Copyright  VHI. All rights reserved.    

## 2018-02-14 NOTE — Therapy (Signed)
[facility] [address] [place], [place], [address] Phone: [phone]   Fax:  [phone]  Physical Therapy Treatment  Patient Details  Name: Keith Rhodes MRN: [id number] Date of Birth: 1995-12-07 Referring Provider: Duwayne HeckJason Rogers, MD   Encounter Date: 02/14/2018  PT End of Session - 02/14/18 1553    Visit Number  3    Number of Visits  12    Date for PT Re-Evaluation  03/13/18    Authorization Type  MCD    Authorization Time Period  02/14/2018-     Authorization - Visit Number  1    Authorization - Number of Visits  12    PT Start Time  0350    PT Stop Time  0440    PT Time Calculation (min)  50 min       Past Medical History:  Diagnosis Date  . Shoulder dislocation    Rightx4    Past Surgical History:  Procedure Laterality Date  . SHOULDER ARTHROSCOPY WITH LABRAL REPAIR Right 12/27/2017   Procedure: Arthroscopic right shoulder labrum repair;  Surgeon: Yolonda Kidaogers, Jason Patrick, MD;  [facility];  Service: Orthopedics;  Laterality: Right;    There were no vitals filed for this visit.  Subjective Assessment - 02/14/18 1553    Subjective  Sore. No pain.          [facility] PT Assessment - 02/14/18 0001      PROM   Overall PROM Comments  95 flexion, 90 abduction, ER 30 @ 20 degrees of abduction, IR 60                   [facility] Adult PT Treatment/Exercise - 02/14/18 0001      Shoulder Exercises: Supine   External Rotation  AAROM;Right;10 reps    External Rotation Weight (lbs)  at 20 deg abd     Flexion  AAROM;Both;10 reps    Shoulder Flexion Weight (lbs)  2 sets 1 supine 1 standing     Other Supine Exercises  chest press x 10 cane     Other Supine Exercises  extension gentle x 10 standing       Shoulder Exercises: Pulleys   Flexion  3 minutes      Shoulder Exercises: Isometric Strengthening   Other Isometric Exercises  5 sec x 10 Flexion, extension, IR, ER       Cryotherapy   Number Minutes Cryotherapy  10 Minutes    Cryotherapy Location  Shoulder    Type of Cryotherapy  Ice pack      Manual Therapy   Passive ROM  Flexion, abduction, ER to 30 degrees, IR              PT Education - 02/14/18 1628    Education Details  HEP    Person(s) Educated  Patient    Methods  Explanation;Handout    Comprehension  Verbalized understanding       PT Short Term Goals - 02/13/18 1537      PT SHORT TERM GOAL #1   Title  He will be independent with inital HEP     Status  On-going      PT SHORT TERM GOAL #2   Title  Pt will perform R shoulder flex to 100 degrees seated.     Baseline  90    Status  On-going      PT SHORT TERM GOAL #3   Title  Pt will be able  to perform R shoulder abduction to 100 degrees to assist with showering    Baseline  80    Status  On-going        PT Long Term Goals - 01/30/18 1747      PT LONG TERM GOAL #1   Title  He will be able to demo all HEP inclding advanced stabilization and strength without pain.     Baseline  initial HEP issued at eval    Time  6    Period  Weeks    Status  New      PT LONG TERM GOAL #2   Title  Pt will demo improved R UE strength to >/= 4+/5 to assist with ADLS and iADLS    Baseline  unable to be assessed at time of eval due to precautions and post-surgical state    Time  6    Period  Weeks    Status  New      PT LONG TERM GOAL #3   Title  Pt will be able to play basketball x 3 min once cleared by MD without pain    Baseline  Not playing sports    Time  6    Period  Weeks    Status  New      PT LONG TERM GOAL #4   Title  He will be able to tolerate body weight with plank / getting off floor using R arm without pain or compensation.     Baseline  unable    Time  6    Period  Weeks    Status  New            Plan - 02/14/18 1625    Clinical Impression Statement  ROM improving however limited. Began table sldes for flexion stretch, Reviewed AAROM HEP and began  isometrics. Updated HEP. No increased pain at end of session,     PT Next Visit Plan  check HEP, AAROM use table, wall as well.  pulleys.  try isometrics     PT Home Exercise Plan  ice, pendulum, scap retract, cane for AAROM , isometrics     Consulted and Agree with Plan of Care  Patient       Patient will benefit from skilled therapeutic intervention in order to improve the following deficits and impairments:  Postural dysfunction, Decreased scar mobility, Decreased mobility, Decreased coordination, Decreased activity tolerance, Decreased endurance, Decreased range of motion, Decreased strength, Hypomobility, Impaired UE functional use, Impaired flexibility, Increased edema, Decreased safety awareness  Visit Diagnosis: Instability of shoulder joint, unspecified laterality  Abnormal posture  Chronic right shoulder pain  Muscle weakness (generalized)     Problem List Patient Active Problem List   Diagnosis Date Noted  . Shoulder joint instability, right 12/27/2017    Sherrie Mustache, PTA 02/14/2018, 4:45 PM  [facility] [address] [place], [place], [address] Phone: [phone]   Fax:  [phone]  Name: Keith Rhodes MRN: [id number] Date of Birth: Aug 12, 1996

## 2018-02-20 ENCOUNTER — Ambulatory Visit: Payer: Medicaid Other | Admitting: Physical Therapy

## 2018-02-20 ENCOUNTER — Encounter: Payer: Self-pay | Admitting: Physical Therapy

## 2018-02-20 DIAGNOSIS — G8929 Other chronic pain: Secondary | ICD-10-CM

## 2018-02-20 DIAGNOSIS — M25319 Other instability, unspecified shoulder: Secondary | ICD-10-CM | POA: Diagnosis not present

## 2018-02-20 DIAGNOSIS — R293 Abnormal posture: Secondary | ICD-10-CM

## 2018-02-20 DIAGNOSIS — M25511 Pain in right shoulder: Secondary | ICD-10-CM

## 2018-02-20 DIAGNOSIS — M6281 Muscle weakness (generalized): Secondary | ICD-10-CM

## 2018-02-20 NOTE — Patient Instructions (Signed)
Strengthening: Isometric Flexion  Using wall for resistance, press right fist into ball using light pressure. Hold ___5_ seconds. Repeat ___5-10_ times per set. Do __1__ sets per session. Do _1___ sessions per day.  SHOULDER: Abduction (Isometric)  Use wall as resistance. Press arm against pillow. Keep elbow straight. Hold __5_ seconds. __5-10_ reps per set, __1_ sets per day, _5-7__ days per week  Extension (Isometric)  Place left bent elbow and back of arm against wall. Press elbow against wall. Hold __5__ seconds. Repeat __5-10__ times. Do __1__ sessions per day.  Internal Rotation (Isometric)  Place palm of right fist against door frame, with elbow bent. Press fist against door frame. Hold __5__ seconds. Repeat _5-10___ times. Do ___1_ sessions per day.  External Rotation (Isometric)  Place back of left fist against door frame, with elbow bent. Press fist against door frame. Hold __5__ seconds. Repeat ___5-10_ times. Do __1 sessions per day.  Copyright  VHI. All rights reserved.

## 2018-02-20 NOTE — Therapy (Signed)
[facility] [address] [place], [place], [address] Phone: [phone]   Fax:  [phone]  Physical Therapy Treatment  Patient Details  Name: Keith Rhodes MRN: [id number] Date of Birth: 1995-09-05 Referring Provider: Duwayne Heck, MD   Encounter Date: 02/20/2018  PT End of Session - 02/20/18 1456    Visit Number  4    Number of Visits  12    Date for PT Re-Evaluation  03/13/18    Authorization Type  MCD    Authorization Time Period  02/14/2018-     Authorization - Visit Number  2    Authorization - Number of Visits  12    PT Start Time  1415    PT Stop Time  1505    PT Time Calculation (min)  50 min    Activity Tolerance  Patient tolerated treatment well;No increased pain    Behavior During Therapy  WFL for tasks assessed/performed       Past Medical History:  Diagnosis Date  . Shoulder dislocation    Rightx4    Past Surgical History:  Procedure Laterality Date  . SHOULDER ARTHROSCOPY WITH LABRAL REPAIR Right 12/27/2017   Procedure: Arthroscopic right shoulder labrum repair;  Surgeon: Yolonda Kida, MD;  Location: [facility];  Service: Orthopedics;  Laterality: Right;    There were no vitals filed for this visit.  Subjective Assessment - 02/20/18 1420    Subjective  Pain in AM only when I sleep on it.  I was really fearful prior to my surgery.  >1 yr between injury and surgery.     Currently in Pain?  No/denies         [facility] PT Assessment - 02/20/18 0001      AROM   Right Shoulder Extension  40 Degrees    Right Shoulder Flexion  104 Degrees    Right Shoulder ABduction  80 Degrees    Right Shoulder Internal Rotation  -- FR to lumbar     Right Shoulder External Rotation  -- FR to back of head, flexed     Left Shoulder Extension  64 Degrees    Left Shoulder Flexion  160 Degrees    Left Shoulder ABduction  168 Degrees    Left Shoulder Internal Rotation  -- FR to T3    Left  Shoulder External Rotation  -- FR to T2                   [facility] Adult PT Treatment/Exercise - 02/20/18 0001      Shoulder Exercises: Standing   Extension  Strengthening;Both;15 reps;Theraband    Theraband Level (Shoulder Extension)  Level 2 (Red)    Row  Strengthening;Both;15 reps;Theraband    Theraband Level (Shoulder Row)  Level 2 (Red)      Shoulder Exercises: Pulleys   Flexion  3 minutes      Shoulder Exercises: ROM/Strengthening   Other ROM/Strengthening Exercises  table slides flexion, scaption and ER x 10 each  with cues       Shoulder Exercises: Isometric Strengthening   Flexion  5X5"    Extension  5X5"    External Rotation  5X5"    Internal Rotation  5X5"    ABduction  5X5"    Other Isometric Exercises  at wall added to HEP       Cryotherapy   Number Minutes Cryotherapy  10 Minutes    Cryotherapy Location  Shoulder    Type of  Cryotherapy  Ice pack      Manual Therapy   Manual Therapy  Joint mobilization;Soft tissue mobilization;Myofascial release;Passive ROM    Joint Mobilization  inferior glide sitting arm propped in abduction     Passive ROM  all planes to tolerance              PT Education - 02/20/18 1456    Education Details  progress, ROM, HEP for isometrics     Person(s) Educated  Patient    Methods  Explanation;Handout;Verbal cues;Tactile cues;Demonstration    Comprehension  Verbalized understanding       PT Short Term Goals - 02/20/18 1457      PT SHORT TERM GOAL #1   Title  He will be independent with inital HEP     Status  On-going      PT SHORT TERM GOAL #2   Title  Pt will perform R shoulder flex to 100 degrees seated.     Status  On-going      PT SHORT TERM GOAL #3   Title  Pt will be able to perform R shoulder abduction to 100 degrees to assist with showering    Status  On-going        PT Long Term Goals - 01/30/18 1747      PT LONG TERM GOAL #1   Title  He will be able to demo all HEP inclding advanced  stabilization and strength without pain.     Baseline  initial HEP issued at eval    Time  6    Period  Weeks    Status  New      PT LONG TERM GOAL #2   Title  Pt will demo improved R UE strength to >/= 4+/5 to assist with ADLS and iADLS    Baseline  unable to be assessed at time of eval due to precautions and post-surgical state    Time  6    Period  Weeks    Status  New      PT LONG TERM GOAL #3   Title  Pt will be able to play basketball x 3 min once cleared by MD without pain    Baseline  Not playing sports    Time  6    Period  Weeks    Status  New      PT LONG TERM GOAL #4   Title  He will be able to tolerate body weight with plank / getting off floor using R arm without pain or compensation.     Baseline  unable    Time  6    Period  Weeks    Status  New            Plan - 02/20/18 1457    Clinical Impression Statement  Improved ROM, initiated strengthening today with min pain increase.  He has significant stiffness but pain is well controlled.  Goals in progress.     PT Treatment/Interventions  ADLs/Self Care Home Management;Neuromuscular re-education;Manual techniques;Functional mobility training;Moist Heat;Therapeutic activities;Patient/family education;Taping;Vasopneumatic Device;Therapeutic exercise;Compression bandaging;Electrical Stimulation;Scar mobilization;Passive range of motion    PT Next Visit Plan  check HEP, AAROM use table, wall as well.  pulleys. check isometrics . Manual, STW.      PT Home Exercise Plan  ice, pendulum, scap retract, cane for AAROM , isometrics     Consulted and Agree with Plan of Care  Patient       Patient will benefit from skilled therapeutic  intervention in order to improve the following deficits and impairments:  Postural dysfunction, Decreased scar mobility, Decreased mobility, Decreased coordination, Decreased activity tolerance, Decreased endurance, Decreased range of motion, Decreased strength, Hypomobility, Impaired UE  functional use, Impaired flexibility, Increased edema, Decreased safety awareness  Visit Diagnosis: Instability of shoulder joint, unspecified laterality  Abnormal posture  Chronic right shoulder pain  Muscle weakness (generalized)     Problem List Patient Active Problem List   Diagnosis Date Noted  . Shoulder joint instability, right 12/27/2017    Keita [place] 02/20/2018, 4:07 PM  [facility] [address] [place], [place], [address] Phone: [phone]   Fax:  [phone]  Name: ORVA GWALTNEY MRN: [id number] Date of Birth: May 09, 1996  Karie Mainland, PT 02/20/18 4:07 PM Phone: [phone] Fax: [phone]

## 2018-02-22 ENCOUNTER — Encounter: Payer: Self-pay | Admitting: Physical Therapy

## 2018-02-22 ENCOUNTER — Ambulatory Visit: Payer: Medicaid Other | Admitting: Physical Therapy

## 2018-02-22 DIAGNOSIS — R293 Abnormal posture: Secondary | ICD-10-CM

## 2018-02-22 DIAGNOSIS — M25319 Other instability, unspecified shoulder: Secondary | ICD-10-CM | POA: Diagnosis not present

## 2018-02-22 DIAGNOSIS — M6281 Muscle weakness (generalized): Secondary | ICD-10-CM

## 2018-02-22 DIAGNOSIS — M25511 Pain in right shoulder: Secondary | ICD-10-CM

## 2018-02-22 DIAGNOSIS — G8929 Other chronic pain: Secondary | ICD-10-CM

## 2018-02-22 NOTE — Therapy (Signed)
[facility] [address] [place], [place], [address] Phone: [phone]   Fax:  [phone]  Physical Therapy Treatment  Patient Details  Name: Keith Rhodes MRN: [id number] Date of Birth: 11/27/1995 Referring Provider: Duwayne HeckJason Rogers, MD   Encounter Date: 02/22/2018  PT End of Session - 02/22/18 1517    Visit Number  5    Number of Visits  12    Date for PT Re-Evaluation  03/13/18    Authorization Type  MCD    Authorization Time Period  02/14/2018-     Authorization - Visit Number  3    Authorization - Number of Visits  12    PT Start Time  1435    PT Stop Time  1521    PT Time Calculation (min)  46 min    Activity Tolerance  Patient tolerated treatment well;No increased pain    Behavior During Therapy  WFL for tasks assessed/performed       Past Medical History:  Diagnosis Date  . Shoulder dislocation    Rightx4    Past Surgical History:  Procedure Laterality Date  . SHOULDER ARTHROSCOPY WITH LABRAL REPAIR Right 12/27/2017   Procedure: Arthroscopic right shoulder labrum repair;  Surgeon: Yolonda Kidaogers, Jason Patrick, MD;  [facility];  Service: Orthopedics;  Laterality: Right;    There were no vitals filed for this visit.  Subjective Assessment - 02/22/18 1439    Subjective  No pain right now.  Been doing his exercises.  Arm tires quickly with work . Throbs in AM.      Currently in Pain?  No/denies              [facility] Adult [facility]/Exercise - 02/22/18 0001      Shoulder Exercises: Prone   Retraction  Strengthening;Both;10 reps    Extension  Strengthening;Both;10 reps      Shoulder Exercises: ROM/Strengthening   UBE (Upper Arm Bike)  5 min , level1 forward, Rt UE tires quickly    Ranger  standing flexion x 10 reps, scaption x 10 with weightshift level 32 , min pain increase     Other ROM/Strengthening Exercises  seated Ranger cross body reaching, circles, flexion , abduction to  ease pain (vs standing)       Cryotherapy   Number Minutes Cryotherapy  10 Minutes    Cryotherapy Location  Shoulder    Type of Cryotherapy  Ice pack      Manual Therapy   Manual Therapy  Joint mobilization;Scapular mobilization    Manual therapy comments  pain end range Abd ER    Joint Mobilization  inferior glide gr I-!!    Scapular Mobilization  in sidelying gentle    Passive ROM  all planes to tolerance  supine and prone              PT Education - 02/22/18 1516    Education Details  scapular symmetry     Person(s) Educated  Patient    Methods  Explanation    Comprehension  Verbalized understanding       PT Short Term Goals - 02/20/18 1457      PT SHORT TERM GOAL #1   Title  He will be independent with inital HEP     Status  On-going      PT SHORT TERM GOAL #2   Title  Pt will perform R shoulder flex to 100 degrees seated.     Status  On-going  PT SHORT TERM GOAL #3   Title  Pt will be able to perform R shoulder abduction to 100 degrees to assist with showering    Status  On-going        PT Long Term Goals - 01/30/18 1747      PT LONG TERM GOAL #1   Title  He will be able to demo all HEP inclding advanced stabilization and strength without pain.     Baseline  initial HEP issued at eval    Time  6    Period  Weeks    Status  New      PT LONG TERM GOAL #2   Title  Pt will demo improved R UE strength to >/= 4+/5 to assist with ADLS and iADLS    Baseline  unable to be assessed at time of eval due to precautions and post-surgical state    Time  6    Period  Weeks    Status  New      PT LONG TERM GOAL #3   Title  Pt will be able to play basketball x 3 min once cleared by MD without pain    Baseline  Not playing sports    Time  6    Period  Weeks    Status  New      PT LONG TERM GOAL #4   Title  He will be able to tolerate body weight with plank / getting off floor using R arm without pain or compensation.     Baseline  unable    Time  6     Period  Weeks    Status  New            Plan - 02/22/18 1517    Clinical Impression Statement  Patient with soreness post session.  Difficulty relaxing but noted increased scapular mobility post manual.  Good retraction in prone.  Progressing.     PT Treatment/Interventions  ADLs/Self Care Home Management;Neuromuscular re-education;Manual techniques;Functional mobility training;Moist Heat;Therapeutic activities;Patient/family education;Taping;Vasopneumatic Device;Therapeutic exercise;Compression bandaging;Electrical Stimulation;Scar mobilization;Passive range of motion    PT Next Visit Plan  check HEP, AAROM use table, wall as well.  pulleys. check isometrics . Manual, STW.      PT Home Exercise Plan  ice, pendulum, scap retract, cane for AAROM , isometrics     Consulted and Agree with Plan of Care  Patient       Patient will benefit from skilled therapeutic intervention in order to improve the following deficits and impairments:  Postural dysfunction, Decreased scar mobility, Decreased mobility, Decreased coordination, Decreased activity tolerance, Decreased endurance, Decreased range of motion, Decreased strength, Hypomobility, Impaired UE functional use, Impaired flexibility, Increased edema, Decreased safety awareness  Visit Diagnosis: Instability of shoulder joint, unspecified laterality  Abnormal posture  Chronic right shoulder pain  Muscle weakness (generalized)     Problem List Patient Active Problem List   Diagnosis Date Noted  . Shoulder joint instability, right 12/27/2017    Payzlee Ryder 02/22/2018, 3:21 PM  [facility] [address] [place], [place], [address] Phone: [phone]   Fax:  [phone]  Name: Keith Rhodes MRN: [id number] Date of Birth: 30-Sep-1995  Karie Mainland, PT 02/22/18 3:21 PM Phone: [phone] Fax: [phone]

## 2018-02-27 ENCOUNTER — Ambulatory Visit: Payer: Medicaid Other | Admitting: Physical Therapy

## 2018-02-27 ENCOUNTER — Encounter: Payer: Self-pay | Admitting: Physical Therapy

## 2018-02-27 DIAGNOSIS — M25511 Pain in right shoulder: Secondary | ICD-10-CM

## 2018-02-27 DIAGNOSIS — M6281 Muscle weakness (generalized): Secondary | ICD-10-CM

## 2018-02-27 DIAGNOSIS — G8929 Other chronic pain: Secondary | ICD-10-CM

## 2018-02-27 DIAGNOSIS — M25319 Other instability, unspecified shoulder: Secondary | ICD-10-CM | POA: Diagnosis not present

## 2018-02-27 DIAGNOSIS — R293 Abnormal posture: Secondary | ICD-10-CM

## 2018-02-27 NOTE — Therapy (Signed)
[facility] [address] [place], [place], [address] Phone: [phone]   Fax:  [phone]  Physical Therapy Treatment  Patient Details  Name: JAVARIE CRISP MRN: [id number] Date of Birth: 08-Jul-1996 Referring Provider: Duwayne Heck, MD   Encounter Date: 02/27/2018  PT End of Session - 02/27/18 1403    Visit Number  6    Number of Visits  12    Date for PT Re-Evaluation  03/13/18    Authorization Type  MCD    Authorization Time Period  02/14/2018-     Authorization - Visit Number  4    Authorization - Number of Visits  12    PT Start Time  1410    PT Stop Time  1505    PT Time Calculation (min)  55 min    Activity Tolerance  Patient tolerated treatment well;No increased pain    Behavior During Therapy  WFL for tasks assessed/performed       Past Medical History:  Diagnosis Date  . Shoulder dislocation    Rightx4    Past Surgical History:  Procedure Laterality Date  . SHOULDER ARTHROSCOPY WITH LABRAL REPAIR Right 12/27/2017   Procedure: Arthroscopic right shoulder labrum repair;  Surgeon: Yolonda Kida, MD;  Location: [facility];  Service: Orthopedics;  Laterality: Right;    There were no vitals filed for this visit.  Subjective Assessment - 02/27/18 1415    Subjective  Was jumping rope this weekend.  No pain.     Currently in Pain?  No/denies         [facility] PT Assessment - 02/27/18 0001      AROM   Right Shoulder Flexion  118 Degrees    Right Shoulder ABduction  110 Degrees        [facility] Adult PT Treatment/Exercise - 02/27/18 0001      Shoulder Exercises: Supine   External Rotation  AAROM;Right;10 reps    Flexion  AAROM;Both;20 reps    Other Supine Exercises  supine ER with trunk rotation, cane     Other Supine Exercises  supine yellow scapular stabilization: narrow grip, horizontal pull and ER/IR       Shoulder Exercises: Standing   Flexion  Strengthening;Both;10 reps  against wall (back) with tight band    Extension  Strengthening;Both;15 reps;Theraband    Theraband Level (Shoulder Extension)  Level 2 (Red)    Row  Strengthening;Both;15 reps;Theraband    Theraband Level (Shoulder Row)  Level 2 (Red)      Shoulder Exercises: ROM/Strengthening   UBE (Upper Arm Bike)  5 min , level 1 forward, Rt UE tires quickly    Wall Wash  flexion with lift off x 10     Wall Pushups  10 reps      Cryotherapy   Number Minutes Cryotherapy  10 Minutes    Cryotherapy Location  Shoulder    Type of Cryotherapy  Ice pack      Manual Therapy   Joint Mobilization  inferior glide gr I-!!    Passive ROM  all planes to tolerance  supine and prone                PT Short Term Goals - 02/27/18 1438      PT SHORT TERM GOAL #1   Title  He will be independent with inital HEP     Status  Achieved      PT SHORT TERM GOAL #2   Title  Pt will perform R shoulder flex to 100 degrees seated.     Baseline  standing 118 deg     Status  On-going      PT SHORT TERM GOAL #3   Title  Pt will be able to perform R shoulder abduction to 100 degrees to assist with showering    Status  On-going        PT Long Term Goals - 02/27/18 1440      PT LONG TERM GOAL #1   Title  He will be able to demo all HEP inclding advanced stabilization and strength without pain.     Status  On-going      PT LONG TERM GOAL #2   Title  Pt will demo improved R UE strength to >/= 4+/5 to assist with ADLS and iADLS    Status  On-going      PT LONG TERM GOAL #3   Title  Pt will be able to play basketball x 3 min once cleared by MD without pain    Status  On-going      PT LONG TERM GOAL #4   Title  He will be able to tolerate body weight with plank / getting off floor using R arm without pain or compensation.     Status  On-going      PT LONG TERM GOAL #5   Title  He will be able to pass basketball and catch without pain.     Status  On-going            Plan - 02/27/18 1453     Clinical Impression Statement  Patient has improved in AROM a great deal.  He needs cues for compensation ,using the wall as a cue.  Tolerating manual and exercises well, shows less anxiety.      PT Next Visit Plan  AROM on wall, can advance to bands for strengthening: scapular stab series , manual , scap mobs     PT Home Exercise Plan  ice, pendulum, scap retract, cane for AAROM , isometrics     Consulted and Agree with Plan of Care  Patient       Patient will benefit from skilled therapeutic intervention in order to improve the following deficits and impairments:  Postural dysfunction, Decreased scar mobility, Decreased mobility, Decreased coordination, Decreased activity tolerance, Decreased endurance, Decreased range of motion, Decreased strength, Hypomobility, Impaired UE functional use, Impaired flexibility, Increased edema, Decreased safety awareness  Visit Diagnosis: Instability of shoulder joint, unspecified laterality  Abnormal posture  Chronic right shoulder pain  Muscle weakness (generalized)     Problem List Patient Active Problem List   Diagnosis Date Noted  . Shoulder joint instability, right 12/27/2017    Yisell Sprunger 02/27/2018, 3:00 PM  [facility] [address] [place], [place], [address] Phone: [phone]   Fax:  [phone]  Name: Julio Almbdirahman S Klosinski MRN: [id number] Date of Birth: Oct 25, 1995  Karie MainlandJennifer Yekaterina Escutia, PT 02/27/18 3:00 PM Phone: [phone] Fax: [phone]

## 2018-02-28 ENCOUNTER — Ambulatory Visit: Payer: Medicaid Other | Admitting: Physical Therapy

## 2018-03-01 ENCOUNTER — Ambulatory Visit: Payer: Medicaid Other | Admitting: Physical Therapy

## 2018-03-01 ENCOUNTER — Encounter: Payer: Self-pay | Admitting: Physical Therapy

## 2018-03-01 DIAGNOSIS — R293 Abnormal posture: Secondary | ICD-10-CM

## 2018-03-01 DIAGNOSIS — M25511 Pain in right shoulder: Secondary | ICD-10-CM

## 2018-03-01 DIAGNOSIS — G8929 Other chronic pain: Secondary | ICD-10-CM

## 2018-03-01 DIAGNOSIS — M25319 Other instability, unspecified shoulder: Secondary | ICD-10-CM | POA: Diagnosis not present

## 2018-03-01 DIAGNOSIS — M6281 Muscle weakness (generalized): Secondary | ICD-10-CM

## 2018-03-01 NOTE — Patient Instructions (Signed)
Over Head Pull: Narrow Grip       On back, knees bent, feet flat, band across thighs, elbows straight but relaxed. Pull hands apart (start). Keeping elbows straight, bring arms up and over head, hands toward floor. Keep pull steady on band. Hold momentarily. Return slowly, keeping pull steady, back to start. Repeat _10-20__ times. Band color ___Y___   Side Pull: Double Arm   On back, knees bent, feet flat. Arms perpendicular to body, shoulder level, elbows straight but relaxed. Pull arms out to sides, elbows straight. Resistance band comes across collarbones, hands toward floor. Hold momentarily. Slowly return to starting position. Repeat 10-20___ times. Band color ___Y__   Sash   On back, knees bent, feet flat, left hand on left hip, right hand above left. Pull right arm DIAGONALLY (hip to shoulder) across chest. Bring right arm along head toward floor. Hold momentarily. Slowly return to starting position. Repeat 10-20___ times. Do with left arm. Band color __Y____   Shoulder Rotation: Double Arm   On back, knees bent, feet flat, elbows tucked at sides, bent 90, hands palms up. Pull hands apart and down toward floor, keeping elbows near sides. Hold momentarily. Slowly return to starting position. Repeat _10-20__ times. Band color ___Y___   

## 2018-03-01 NOTE — Therapy (Signed)
[facility] [place], [place], [address] Phone: [phone]   Fax:  [phone]  Physical Therapy Treatment  Patient Details  Name: Keith Rhodes MRN: [id number] Date of Birth: 1996-07-14 Referring Provider: Victorino December, MD   Encounter Date: 03/01/2018  PT End of Session - 03/01/18 1021    Visit Number  7    Number of Visits  12    Date for PT Re-Evaluation  03/13/18    Authorization Type  MCD    Authorization Time Period  02/14/2018-     Authorization - Visit Number  5    Authorization - Number of Visits  12    PT Start Time  7357    PT Stop Time  8978    PT Time Calculation (min)  38 min       Past Medical History:  Diagnosis Date  . Shoulder dislocation    Rightx4    Past Surgical History:  Procedure Laterality Date  . SHOULDER ARTHROSCOPY WITH LABRAL REPAIR Right 12/27/2017   Procedure: Arthroscopic right shoulder labrum repair;  Surgeon: Nicholes Stairs, MD;  Location: [facility];  Service: Orthopedics;  Laterality: Right;    There were no vitals filed for this visit.  Subjective Assessment - 03/01/18 1021    Subjective  I want to play basketball by end of September.     Currently in Pain?  No/denies         [facility] PT Assessment - 03/01/18 0001      AROM   Right Shoulder Flexion  120 Degrees    Right Shoulder ABduction  115 Degrees                   [facility] Adult PT Treatment/Exercise - 03/01/18 0001      Shoulder Exercises: Supine   Flexion  AAROM;Both;20 reps    Other Supine Exercises  supine ER with trunk rotation, cane     Other Supine Exercises  supine yellow scapular stabilization: narrow grip, horizontal pull and ER/IR       Shoulder Exercises: Standing   Extension  Strengthening;Both;Theraband;20 reps    Theraband Level (Shoulder Extension)  Level 2 (Red)    Row  Strengthening;Both;Theraband;20 reps    Theraband Level (Shoulder Row)  Level  2 (Red)      Shoulder Exercises: ROM/Strengthening   UBE (Upper Arm Bike)  Level 2 3 min forward, 3 min back     Wall Wash  flexion with lift off x 10     Wall Pushups  10 reps      Cryotherapy   Number Minutes Cryotherapy  10 Minutes    Cryotherapy Location  Shoulder    Type of Cryotherapy  Ice pack      Manual Therapy   Passive ROM  Flexion, ER             PT Education - 03/01/18 1049    Education Details  HEP    Person(s) Educated  Patient    Methods  Explanation;Handout    Comprehension  Verbalized understanding       PT Short Term Goals - 03/01/18 1101      PT SHORT TERM GOAL #1   Title  He will be independent with inital HEP     Status  Achieved      PT SHORT TERM GOAL #2   Title  Pt will perform R shoulder flex to 100 degrees seated.  Baseline  118 deg     Time  3    Period  Weeks    Status  Achieved      PT SHORT TERM GOAL #3   Title  Pt will be able to perform R shoulder abduction to 100 degrees to assist with showering    Baseline  115    Time  3    Period  Weeks    Status  Achieved        PT Long Term Goals - 02/27/18 1440      PT LONG TERM GOAL #1   Title  He will be able to demo all HEP inclding advanced stabilization and strength without pain.     Status  On-going      PT LONG TERM GOAL #2   Title  Pt will demo improved R UE strength to >/= 4+/5 to assist with ADLS and iADLS    Status  On-going      PT LONG TERM GOAL #3   Title  Pt will be able to play basketball x 3 min once cleared by MD without pain    Status  On-going      PT LONG TERM GOAL #4   Title  He will be able to tolerate body weight with plank / getting off floor using R arm without pain or compensation.     Status  On-going      PT LONG TERM GOAL #5   Title  He will be able to pass basketball and catch without pain.     Status  On-going            Plan - 03/01/18 1053    Clinical Impression Statement  Repeated supine scapular bands and updated HEP. ROM  improving. STG# 2, #3 met.     PT Next Visit Plan  AROM on wall, can advance to bands for strengthening: scapular stab series , manual , scap mobs     PT Home Exercise Plan  ice, pendulum, scap retract, cane for AAROM , isometrics , supine scap stab yellow    Consulted and Agree with Plan of Care  Patient       Patient will benefit from skilled therapeutic intervention in order to improve the following deficits and impairments:  Postural dysfunction, Decreased scar mobility, Decreased mobility, Decreased coordination, Decreased activity tolerance, Decreased endurance, Decreased range of motion, Decreased strength, Hypomobility, Impaired UE functional use, Impaired flexibility, Increased edema, Decreased safety awareness  Visit Diagnosis: Instability of shoulder joint, unspecified laterality  Abnormal posture  Chronic right shoulder pain  Muscle weakness (generalized)     Problem List Patient Active Problem List   Diagnosis Date Noted  . Shoulder joint instability, right 12/27/2017    Dorene Ar, PTA 03/01/2018, 11:03 AM  [place], [place], [address] Phone: [phone]   Fax:  [phone]  Name: Keith Rhodes MRN: [id number] Date of Birth: 09/23/1995

## 2018-03-07 ENCOUNTER — Encounter: Payer: Self-pay | Admitting: Physical Therapy

## 2018-03-07 ENCOUNTER — Ambulatory Visit: Payer: Medicaid Other | Admitting: Physical Therapy

## 2018-03-07 DIAGNOSIS — G8929 Other chronic pain: Secondary | ICD-10-CM

## 2018-03-07 DIAGNOSIS — M25319 Other instability, unspecified shoulder: Secondary | ICD-10-CM

## 2018-03-07 DIAGNOSIS — M25511 Pain in right shoulder: Secondary | ICD-10-CM

## 2018-03-07 DIAGNOSIS — R293 Abnormal posture: Secondary | ICD-10-CM

## 2018-03-07 DIAGNOSIS — M6281 Muscle weakness (generalized): Secondary | ICD-10-CM

## 2018-03-07 NOTE — Therapy (Signed)
[facility] [address] [place], [place], [address] Phone: [phone]   Fax:  [phone]  Physical Therapy Treatment  Patient Details  Name: Keith Rhodes MRN: [id number] Date of Birth: [birth year] Referring Provider: Duwayne HeckJason Rogers, MD   Encounter Date: 03/07/2018  PT End of Session - 03/07/18 1504    Visit Number  8    Number of Visits  12    Date for PT Re-Evaluation  03/13/18    Authorization Type  MCD    Authorization Time Period  02/14/2018-     Authorization - Visit Number  6    Authorization - Number of Visits  12    PT Start Time  0302       Past Medical History:  Diagnosis Date  . Shoulder dislocation    Rightx4    Past Surgical History:  Procedure Laterality Date  . SHOULDER ARTHROSCOPY WITH LABRAL REPAIR Right 12/27/2017   Procedure: Arthroscopic right shoulder labrum repair;  Surgeon: Yolonda Kidaogers, Jason Patrick, MD;  [facility] [place];  Service: Orthopedics;  Laterality: Right;    There were no vitals filed for this visit.      [facility] - 03/07/18 0001      Observation/Other Assessments   Focus on Therapeutic Outcomes (FOTO)   Not taken                   [facility] Treatment/Exercise - 03/07/18 0001      Shoulder Exercises: Supine   Other Supine Exercises  supine red band horizontal, narrow grip       Shoulder Exercises: Prone   Retraction  Strengthening;Both;10 reps    Horizontal ABduction 1  Strengthening;10 reps    Other Prone Exercises  Y      Shoulder Exercises: Standing   External Rotation  10 reps red    Internal Rotation  10 reps red    Extension  Strengthening;Both;Theraband;20 reps    Theraband Level (Shoulder Extension)  Level 2 (Red)    Row  Strengthening;Both;Theraband;20 reps    Theraband Level (Shoulder Row)  Level 3 (Green)      Shoulder Exercises: Pulleys   Flexion  2 minutes      Shoulder Exercises: ROM/Strengthening   UBE (Upper Arm Bike)  Level 2 3 min forward, 3 min back     Wall Wash  flexion with lift off x 10 , pilloe case slides bilateral     Wall Pushups  10 reps      Cryotherapy   Number Minutes Cryotherapy  --    Cryotherapy Location  --    Type of Cryotherapy  --      Manual Therapy   Manual therapy comments  soft tissue work to pec/anterior shoulder    Passive ROM  Flexion, ER, IR, abduction             PT Education - 03/07/18 1540    Education Details  HEP    Person(s) Educated  Patient    Methods  Explanation;Handout    Comprehension  Verbalized understanding       PT Short Term Goals - 03/01/18 1101      PT SHORT TERM GOAL #1   Title  He will be independent with inital HEP     Status  Achieved      PT SHORT TERM GOAL #2   Title  Pt will perform R shoulder flex to 100 degrees seated.  Baseline  118 deg     Time  3    Period  Weeks    Status  Achieved      PT SHORT TERM GOAL #3   Title  Pt will be able to perform R shoulder abduction to 100 degrees to assist with showering    Baseline  115    Time  3    Period  Weeks    Status  Achieved        PT Long Term Goals - 02/27/18 1440      PT LONG TERM GOAL #1   Title  He will be able to demo all HEP inclding advanced stabilization and strength without pain.     Status  On-going      PT LONG TERM GOAL #2   Title  Pt will demo improved R UE strength to >/= 4+/5 to assist with ADLS and iADLS    Status  On-going      PT LONG TERM GOAL #3   Title  Pt will be able to play basketball x 3 min once cleared by MD without pain    Status  On-going      PT LONG TERM GOAL #4   Title  He will be able to tolerate body weight with plank / getting off floor using R arm without pain or compensation.     Status  On-going      PT LONG TERM GOAL #5   Title  He will be able to pass basketball and catch without pain.     Status  On-going            Plan - 03/07/18 1537    Clinical Impression Statement  Pt reports  no pain. He feels there is a point at which his shoulder may dislocate when he is strethcing into flexion. He is concerned about his progress as he wants to play basketball. Updated HER with IR /ER theraband strengthening.     PT Next Visit Plan  AROM on wall, can advance to bands for strengthening: scapular stab series , manual , scap mobs     PT Home Exercise Plan  ice, pendulum, scap retract, cane for AAROM , isometrics , supine scap stab yellow, standing IR/ER red vs yellow     Consulted and Agree with Plan of Care  Patient       Patient will benefit from skilled therapeutic intervention in order to improve the following deficits and impairments:  Postural dysfunction, Decreased scar mobility, Decreased mobility, Decreased coordination, Decreased activity tolerance, Decreased endurance, Decreased range of motion, Decreased strength, Hypomobility, Impaired UE functional use, Impaired flexibility, Increased edema, Decreased safety awareness  Visit Diagnosis: Instability of shoulder joint, unspecified laterality  Abnormal posture  Chronic right shoulder pain  Muscle weakness (generalized)     Problem List Patient Active Problem List   Diagnosis Date Noted  . Shoulder joint instability, right 12/27/2017    Sherrie Mustache, PTA 03/07/2018, 4:05 PM  [facility] [address] [place], [place], [address] Phone: [phone]   Fax:  [phone]  Name: Keith Rhodes MRN: [id number] Date of Birth: 08/15/1996

## 2018-03-07 NOTE — Patient Instructions (Signed)
Strengthening: Resisted Internal Rotation   Hold tubing in left hand, elbow at side and forearm out. Rotate forearm in across body. Repeat __10__ times per set. Do ___2_ sets per session. Do __2__ sessions per day.  http://orth.exer.us/830   Copyright  VHI. All rights reserved.  Strengthening: Resisted External Rotation   Hold tubing in right hand, elbow at side and forearm across body. Rotate forearm out. Repeat _10___ times per set. Do __2__ sets per session. Do ___2_ sessions per day.    

## 2018-03-09 ENCOUNTER — Ambulatory Visit: Payer: Medicaid Other | Admitting: Physical Therapy

## 2018-03-13 ENCOUNTER — Ambulatory Visit: Payer: Medicaid Other | Admitting: Physical Therapy

## 2018-03-14 ENCOUNTER — Ambulatory Visit: Payer: Medicaid Other | Admitting: Physical Therapy

## 2018-03-14 ENCOUNTER — Encounter: Payer: Self-pay | Admitting: Physical Therapy

## 2018-03-14 DIAGNOSIS — M25511 Pain in right shoulder: Secondary | ICD-10-CM

## 2018-03-14 DIAGNOSIS — R293 Abnormal posture: Secondary | ICD-10-CM

## 2018-03-14 DIAGNOSIS — M25319 Other instability, unspecified shoulder: Secondary | ICD-10-CM

## 2018-03-14 DIAGNOSIS — M6281 Muscle weakness (generalized): Secondary | ICD-10-CM

## 2018-03-14 DIAGNOSIS — G8929 Other chronic pain: Secondary | ICD-10-CM

## 2018-03-14 NOTE — Therapy (Signed)
[facility] [address] [place], [place], [address] Phone: [phone]   Fax:  [phone]  Physical Therapy Treatment  Patient Details  Name: Keith Rhodes MRN: [id number] Date of Birth: 1995/10/12 Referring Provider: Duwayne HeckJason Rogers, MD   Encounter Date: 03/14/2018  PT End of Session - 03/14/18 1539    Visit Number  9    Number of Visits  14    Date for PT Re-Evaluation  03/31/18    Authorization Type  MCD    Authorization Time Period  02/14/2018- 03/27/18    Authorization - Visit Number  7    Authorization - Number of Visits  12    PT Start Time  1522    PT Stop Time  1600    PT Time Calculation (min)  38 min    Activity Tolerance  Patient tolerated treatment well;No increased pain    Behavior During Therapy  WFL for tasks assessed/performed       Past Medical History:  Diagnosis Date  . Shoulder dislocation    Rightx4    Past Surgical History:  Procedure Laterality Date  . SHOULDER ARTHROSCOPY WITH LABRAL REPAIR Right 12/27/2017   Procedure: Arthroscopic right shoulder labrum repair;  Surgeon: Yolonda Kidaogers, Jason Patrick, MD;  [facility] [place];  Service: Orthopedics;  Laterality: Right;    There were no vitals filed for this visit.  Subjective Assessment - 03/14/18 1525    Subjective  Shoulder is good, able to reach up higher than before.     Currently in Pain?  No/denies         [facility] - 03/14/18 0001      AROM   Right Shoulder Flexion  122 Degrees    Right Shoulder ABduction  126 Degrees    Right Shoulder Internal Rotation  45 Degrees    Right Shoulder External Rotation  50 Degrees      PROM   Right Shoulder External Rotation  55 Degrees      Strength   Right Shoulder Flexion  4-/5    Right Shoulder Internal Rotation  5/5    Right Shoulder External Rotation  4-/5          [facility] Adult PT Treatment/Exercise - 03/14/18 0001      Shoulder Exercises: Standing    Flexion  AAROM;Strengthening;Both;10 reps cane     Extension  Strengthening;Both;Theraband;20 reps    Theraband Level (Shoulder Extension)  Level 2 (Red)    Row  Strengthening;Both;Theraband;20 reps    Theraband Level (Shoulder Row)  Level 3 (Green)    Other Standing Exercises  standing cane for AAROM/Strength  x10 flex, abd, cross body  and ext, IR       Shoulder Exercises: ROM/Strengthening   Wall Pushups  10 reps    Modified Plank  Other (comment) used high table for modified plank work: shoulder taps , lat    Other ROM/Strengthening Exercises  Body blade 30 sec: abd, ER, IR, flex/ext       Manual Therapy   Passive ROM  Flexion, ER, IR, abduction             PT Education - 03/14/18 1601    Education Details  heavier bands, progress, POC , scapular position     Person(s) Educated  Patient    Methods  Explanation    Comprehension  Verbalized understanding       PT Short Term Goals - 03/14/18 1539      PT  SHORT TERM GOAL #1   Title  He will be independent with inital HEP     Status  Achieved      PT SHORT TERM GOAL #2   Title  Pt will perform R shoulder flex to 100 degrees seated.     Status  Achieved      PT SHORT TERM GOAL #3   Title  Pt will be able to perform R shoulder abduction to 100 degrees to assist with showering    Status  Achieved        PT Long Term Goals - 03/14/18 1541      PT LONG TERM GOAL #1   Title  He will be able to demo all HEP inclding advanced stabilization and strength without pain.     Status  On-going      PT LONG TERM GOAL #2   Title  Pt will demo improved R UE strength to >/= 4+/5 to assist with ADLS and iADLS    Status  On-going      PT LONG TERM GOAL #3   Title  Pt will be able to play basketball x 3 min once cleared by MD without pain    Status  On-going      PT LONG TERM GOAL #4   Title  He will be able to tolerate body weight with plank / getting off floor using R arm without pain or compensation.     Status  On-going       PT LONG TERM GOAL #5   Title  He will be able to pass basketball and catch without pain.     Status  On-going            Plan - 03/14/18 1605    Clinical Impression Statement  Patient continue to have min to no pain with activities.  He has some significant periscapular weakness evident with closed chain exercices.  Advanced to red/green ands for HEP.  Goals in progress.  Pt will continue to benefit from PT until he leaves for school week of 04/03/18.     PT Treatment/Interventions  ADLs/Self Care Home Management;Neuromuscular re-education;Manual techniques;Functional mobility training;Moist Heat;Therapeutic activities;Patient/family education;Taping;Vasopneumatic Device;Therapeutic exercise;Compression bandaging;Electrical Stimulation;Scar mobilization;Passive range of motion    PT Next Visit Plan  AROM on wall, can advance to bands for strengthening: scapular stab series , manual , scap mobs     PT Home Exercise Plan  ice, pendulum, scap retract, cane for AAROM , isometrics , supine scap stab yellow, standing IR/ER red vs yellow     Consulted and Agree with Plan of Care  Patient       Patient will benefit from skilled therapeutic intervention in order to improve the following deficits and impairments:  Postural dysfunction, Decreased scar mobility, Decreased mobility, Decreased coordination, Decreased activity tolerance, Decreased endurance, Decreased range of motion, Decreased strength, Hypomobility, Impaired UE functional use, Impaired flexibility, Increased edema, Decreased safety awareness  Visit Diagnosis: Instability of shoulder joint, unspecified laterality  Abnormal posture  Chronic right shoulder pain  Muscle weakness (generalized)     Problem List Patient Active Problem List   Diagnosis Date Noted  . Shoulder joint instability, right 12/27/2017    PAA,JENNIFER 03/14/2018, 4:08 PM  [facility] [address] [place], [place], [address] Phone: [phone]   Fax:  [phone]  Name: Keith Rhodes MRN: [id number] Date of Birth: 04-16-1996  Karie Mainland, PT 03/14/18 4:08 PM Phone: [phone] Fax: [phone]

## 2018-03-14 NOTE — Patient Instructions (Signed)
Wall Shoulder Press-Out    With palms flat on wall, shoulder-width apart, and elbows straight, press shoulders back. Return. Repeat __10__ times or for __2 sets__ minutes. Do __1-2_ sessions per day.  http://cc.exer.us/56   Copyright  VHI. All rights reserved.

## 2018-03-15 NOTE — Addendum Note (Signed)
Addended by: Karie MainlandPAA, JENNIFER L on: 03/15/2018 09:07 AM   Modules accepted: Orders

## 2018-03-16 ENCOUNTER — Telehealth: Payer: Self-pay | Admitting: Physical Therapy

## 2018-03-16 ENCOUNTER — Encounter: Payer: Medicaid Other | Admitting: Physical Therapy

## 2018-03-16 NOTE — Telephone Encounter (Signed)
Spoke to patient regarding no-show to appointment at 1 pm. He thought his appointment was at 3 pm. Confirmed next appointment August 7th at 3 pm.

## 2018-03-22 ENCOUNTER — Ambulatory Visit: Payer: Medicaid Other | Attending: Orthopedic Surgery | Admitting: Physical Therapy

## 2018-03-22 ENCOUNTER — Encounter: Payer: Self-pay | Admitting: Physical Therapy

## 2018-03-22 DIAGNOSIS — M25511 Pain in right shoulder: Secondary | ICD-10-CM | POA: Insufficient documentation

## 2018-03-22 DIAGNOSIS — M25319 Other instability, unspecified shoulder: Secondary | ICD-10-CM | POA: Diagnosis not present

## 2018-03-22 DIAGNOSIS — G8929 Other chronic pain: Secondary | ICD-10-CM | POA: Diagnosis present

## 2018-03-22 DIAGNOSIS — M6281 Muscle weakness (generalized): Secondary | ICD-10-CM | POA: Diagnosis present

## 2018-03-22 DIAGNOSIS — R293 Abnormal posture: Secondary | ICD-10-CM | POA: Diagnosis present

## 2018-03-22 NOTE — Therapy (Signed)
[facility] [place], [place], [address] Phone: [phone]   Fax:  [phone]  Physical Therapy Treatment  Patient Details  Name: Keith Rhodes MRN: [id number] Date of Birth: [birth year] Referring Provider: Victorino December, MD   Encounter Date: 03/22/2018  PT End of Session - 03/22/18 1517    Visit Number  10    Number of Visits  14    Date for PT Re-Evaluation  03/31/18    Authorization Type  MCD    Authorization Time Period  02/14/2018- 03/27/18    Authorization - Number of Visits  12    PT Start Time  2330    PT Stop Time  1606    PT Time Calculation (min)  49 min    Activity Tolerance  Patient limited by fatigue       Past Medical History:  Diagnosis Date  . Shoulder dislocation    Rightx4    Past Surgical History:  Procedure Laterality Date  . SHOULDER ARTHROSCOPY WITH LABRAL REPAIR Right 12/27/2017   Procedure: Arthroscopic right shoulder labrum repair;  Surgeon: Nicholes Stairs, MD;  [facility];  Service: Orthopedics;  Laterality: Right;    There were no vitals filed for this visit.  Subjective Assessment - 03/22/18 1517    Subjective  Pt reports a lot of improvements in the shoulder, able to reach further, not feeling hesitations with the stretches any more. Biggest concern is he wishes to play basketball at school.     Pertinent History  Dislocated R shoulder 4x. R labral repair 12/27/17 with hardware replacement. Supposed to wear sling 4-6 weeks. June 25 RTD. Very active in sports.    Currently in Pain?  No/denies         [facility] PT Assessment - 03/22/18 0001      Assessment   Medical Diagnosis  R labral repair 12/27/17      AROM   Right Shoulder Flexion  132 Degrees    Right Shoulder External Rotation  62 Degrees      Strength   Overall Strength Comments  mid trap Rt 3+/5, Lt 4/5                   [facility] Adult PT Treatment/Exercise - 03/22/18  0001      Exercises   Exercises  Shoulder      Shoulder Exercises: Supine   Flexion  AAROM;Both;15 reps overhead stretch with strap      Shoulder Exercises: Prone   Other Prone Exercises  2x10 T's off EOB palms down,      Shoulder Exercises: Sidelying   External Rotation  Strengthening;Right;Weights;10 reps towel under arm, 3 sets    External Rotation Weight (lbs)  2# first set, 1# for second and third set    Other Sidelying Exercises  2x10 red band upper trunk rotations with arms straight Rt       Shoulder Exercises: Standing   Other Standing Exercises  bow and arrow blue band 2x10 VC for form, decreased to green band half way through due to fatigue    Other Standing Exercises  2x10 scap stab ex , green band around wrists, forearms on wall and sliding up.       Shoulder Exercises: Pulleys   Flexion  2 minutes      Modalities   Modalities  Cryotherapy      Cryotherapy   Number Minutes Cryotherapy  10 Minutes    Cryotherapy Location  Shoulder    Type of Cryotherapy  Ice pack      Manual Therapy   Joint Mobilization  into ER, posterior and inferior GH mobs,and gentle distraction of the Rt UE    Passive ROM  flexion, ER               PT Short Term Goals - 03/22/18 1535      PT SHORT TERM GOAL #1   Title  He will be independent with inital HEP     Status  Achieved      PT SHORT TERM GOAL #2   Title  Pt will perform R shoulder flex to 100 degrees seated.     Status  Achieved      PT SHORT TERM GOAL #3   Title  Pt will be able to perform R shoulder abduction to 100 degrees to assist with showering    Status  Achieved        PT Long Term Goals - 03/22/18 1535      PT LONG TERM GOAL #1   Title  He will be able to demo all HEP inclding advanced stabilization and strength without pain.     Status  On-going      PT LONG TERM GOAL #2   Title  Pt will demo improved R UE strength to >/= 4+/5 to assist with ADLS and iADLS    Status  Partially Met      PT LONG  TERM GOAL #3   Title  Pt will be able to play basketball x 3 min once cleared by MD without pain    Status  On-going      PT LONG TERM GOAL #4   Title  He will be able to tolerate body weight with plank / getting off floor using R arm without pain or compensation.     Status  On-going      PT LONG TERM GOAL #5   Title  He will be able to pass basketball and catch without pain.     Status  On-going            Plan - 03/22/18 1539    Clinical Impression Statement  Pt tolerated ther ex well, did fatigue with resistance training having to drop weights He has partially met his strength goal.  Pt leaves 8/17 to return to school, sees MD 03/31/18. He has some increase in Rt shoulder flexibility and partially met his strength goal.     Rehab Potential  Excellent    PT Frequency  2x / week    PT Duration  6 weeks    PT Treatment/Interventions  ADLs/Self Care Home Management;Neuromuscular re-education;Manual techniques;Functional mobility training;Moist Heat;Therapeutic activities;Patient/family education;Taping;Vasopneumatic Device;Therapeutic exercise;Compression bandaging;Electrical Stimulation;Scar mobilization;Passive range of motion    PT Next Visit Plan  cont scapular stability ex and slowly add in RTC. try light ball bounces on wall    Consulted and Agree with Plan of Care  Patient       Patient will benefit from skilled therapeutic intervention in order to improve the following deficits and impairments:  Postural dysfunction, Decreased scar mobility, Decreased mobility, Decreased coordination, Decreased activity tolerance, Decreased endurance, Decreased range of motion, Decreased strength, Hypomobility, Impaired UE functional use, Impaired flexibility, Increased edema, Decreased safety awareness  Visit Diagnosis: Instability of shoulder joint, unspecified laterality  Abnormal posture  Chronic right shoulder pain  Muscle weakness (generalized)     Problem List Patient Active  Problem List   Diagnosis  Date Noted  . Shoulder joint instability, right 12/27/2017    Jeral Pinch PT  03/22/2018, 4:00 PM  [facility] [address] [place], [place], [address] Phone: [phone]   Fax:  [phone]  Name: Keith Rhodes MRN: [id number] Date of Birth: 08-13-1996

## 2018-03-23 ENCOUNTER — Encounter: Payer: Self-pay | Admitting: Physical Therapy

## 2018-03-23 ENCOUNTER — Ambulatory Visit: Payer: Medicaid Other | Admitting: Physical Therapy

## 2018-03-23 DIAGNOSIS — M25319 Other instability, unspecified shoulder: Secondary | ICD-10-CM

## 2018-03-23 DIAGNOSIS — R293 Abnormal posture: Secondary | ICD-10-CM

## 2018-03-23 DIAGNOSIS — M6281 Muscle weakness (generalized): Secondary | ICD-10-CM

## 2018-03-23 DIAGNOSIS — G8929 Other chronic pain: Secondary | ICD-10-CM

## 2018-03-23 DIAGNOSIS — M25511 Pain in right shoulder: Secondary | ICD-10-CM

## 2018-03-23 NOTE — Therapy (Signed)
[facility] [address] [place], [place], [address] Phone: [phone]   Fax:  [phone]  Physical Therapy Treatment  Patient Details  Name: Keith Rhodes MRN: [id number] Date of Birth: [birth year] Referring Provider: Duwayne HeckJason Rogers, MD   Encounter Date: 03/23/2018  PT End of Session - 03/23/18 1021    Visit Number  11    Number of Visits  14    Date for PT Re-Evaluation  03/31/18    PT Start Time  1022    PT Stop Time  1112    PT Time Calculation (min)  50 min    Activity Tolerance  Patient tolerated treatment well       Past Medical History:  Diagnosis Date  . Shoulder dislocation    Rightx4    Past Surgical History:  Procedure Laterality Date  . SHOULDER ARTHROSCOPY WITH LABRAL REPAIR Right 12/27/2017   Procedure: Arthroscopic right shoulder labrum repair;  Surgeon: Yolonda Kidaogers, Jason Patrick, MD;  [facility] [place];  Service: Orthopedics;  Laterality: Right;    There were no vitals filed for this visit.  Subjective Assessment - 03/23/18 1023    Subjective  Pt reports he is feeling good, not real sore after yesterdays treatment    Patient Stated Goals  play basketball, boxing (would dislocate after basketball)    Currently in Pain?  No/denies                       [facility] Treatment/Exercise - 03/23/18 0001      Exercises   Exercises  Shoulder      Shoulder Exercises: Standing   External Rotation  Strengthening;Right;10 reps;Theraband    Theraband Level (Shoulder External Rotation)  Level 3 (Green)    Internal Rotation  Strengthening;Right;10 reps;Theraband    Theraband Level (Shoulder Internal Rotation)  Level 3 (Green)    Diagonals  Strengthening;Right;Theraband    Theraband Level (Shoulder Diagonals)  Level 2 (Red)    Other Standing Exercises  ball tosses to wall 30 sec bouts, chest level and overhead, 5 reps, VC to keep scapula engaged    Other Standing  Exercises  15 reps high to low plank on a wall      Shoulder Exercises: ROM/Strengthening   UBE (Upper Arm Bike)  L3x4' alt FWD/BWD      Shoulder Exercises: Stretch   Other Shoulder Stretches  mid level pec doorway stretch, then into shoulder flexion, doorway stretches with strap into extension & flexion      Manual Therapy   Manual Therapy  Myofascial release;Passive ROM    Joint Mobilization  inferior Rt GH mobs    Myofascial Release  in s/l, releases to Rt lats, subscap    Passive ROM  flexion, ER             PT Education - 03/23/18 1042    Education Details  DN    Person(s) Educated  Patient    Methods  Explanation;Handout    Comprehension  Verbalized understanding                 Plan - 03/23/18 1048    Clinical Impression Statement  Pt has tightness around the Rt scapula, would benefit from DN to subscap, teres minor/major.  He is not ready for this today, will think about it.  He had increase of pain with dribbling basketball overhead on wall, settled down with rest.     Rehab Potential  Excellent  PT Frequency  2x / week    PT Duration  6 weeks    PT Treatment/Interventions  ADLs/Self Care Home Management;Neuromuscular re-education;Manual techniques;Functional mobility training;Moist Heat;Therapeutic activities;Patient/family education;Taping;Vasopneumatic Device;Therapeutic exercise;Compression bandaging;Electrical Stimulation;Scar mobilization;Passive range of motion    PT Next Visit Plan  cont scapular stability ex and slowly add in RTC. cont light ball bounces on wall    Consulted and Agree with Plan of Care  Patient       Patient will benefit from skilled therapeutic intervention in order to improve the following deficits and impairments:  Postural dysfunction, Decreased scar mobility, Decreased mobility, Decreased coordination, Decreased activity tolerance, Decreased endurance, Decreased range of motion, Decreased strength, Hypomobility, Impaired UE  functional use, Impaired flexibility, Increased edema, Decreased safety awareness  Visit Diagnosis: Instability of shoulder joint, unspecified laterality  Abnormal posture  Chronic right shoulder pain  Muscle weakness (generalized)     Problem List Patient Active Problem List   Diagnosis Date Noted  . Shoulder joint instability, right 12/27/2017    Roderic Scarce PT  03/23/2018, 12:09 PM  [facility] [address] [place], [place], [address] Phone: [phone]   Fax:  [phone]  Name: Keith Rhodes MRN: [id number] Date of Birth: 01-19-1996

## 2018-03-23 NOTE — Patient Instructions (Signed)

## 2018-03-28 ENCOUNTER — Encounter: Payer: Self-pay | Admitting: Physical Therapy

## 2018-03-28 ENCOUNTER — Ambulatory Visit: Payer: Medicaid Other | Admitting: Physical Therapy

## 2018-03-28 DIAGNOSIS — M6281 Muscle weakness (generalized): Secondary | ICD-10-CM

## 2018-03-28 DIAGNOSIS — G8929 Other chronic pain: Secondary | ICD-10-CM

## 2018-03-28 DIAGNOSIS — M25319 Other instability, unspecified shoulder: Secondary | ICD-10-CM | POA: Diagnosis not present

## 2018-03-28 DIAGNOSIS — R293 Abnormal posture: Secondary | ICD-10-CM

## 2018-03-28 DIAGNOSIS — M25511 Pain in right shoulder: Secondary | ICD-10-CM

## 2018-03-28 NOTE — Therapy (Signed)
[place], [place], [address] Phone: [phone]   Fax:  [phone]  Physical Therapy Treatment/Discharge   Patient Details  Name: Keith Rhodes MRN: [id number] Date of Birth: 1996-04-22 Referring Provider: Victorino December, MD   Encounter Date: 03/28/2018  PT End of Session - 03/28/18 1114    Visit Number  12    Date for PT Re-Evaluation  03/31/18    Authorization Type  MCD    Authorization Time Period  02/14/2018- 03/27/18    PT Start Time  1030    PT Stop Time  1115    PT Time Calculation (min)  45 min    Activity Tolerance  Patient tolerated treatment well    Behavior During Therapy  [facility] for tasks assessed/performed       Past Medical History:  Diagnosis Date  . Shoulder dislocation    Rightx4    Past Surgical History:  Procedure Laterality Date  . SHOULDER ARTHROSCOPY WITH LABRAL REPAIR Right 12/27/2017   Procedure: Arthroscopic right shoulder labrum repair;  Surgeon: Nicholes Stairs, MD;  Location: [facility];  Service: Orthopedics;  Laterality: Right;    There were no vitals filed for this visit.  Subjective Assessment - 03/28/18 1037    Subjective  Pt arrives 15 min late.  No pain, goes back to school this weekend [facility]).     Currently in Pain?  No/denies         [facility] PT Assessment - 03/28/18 0001      AROM   Right Shoulder Flexion  133 Degrees    Right Shoulder ABduction  130 Degrees   pure abd 110   Right Shoulder Internal Rotation  --   FR to mid back stiff   Right Shoulder External Rotation  --   Fr to bck of neck arm in flexion stiff      Strength   Right Shoulder Flexion  4+/5    Right Shoulder ABduction  4/5    Right Shoulder Internal Rotation  5/5    Right Shoulder External Rotation  4/5        [facility] Adult PT Treatment/Exercise - 03/28/18 0001      Shoulder Exercises: Supine   Horizontal ABduction  Strengthening;Both;10 reps    External Rotation  Strengthening;Both;10 reps    Other Supine Exercises  supine scapular stabilization green band x 10    see above also narrow grip x 10      Shoulder Exercises: ROM/Strengthening   UBE (Upper Arm Bike)  6 min Level 3, 3 min Fw and 3 min back     Modified Plank  30 seconds;2 reps   on elbows    Other ROM/Strengthening Exercises  single arm rebounder Rt UE for 2 min catch and toss, no increased pain overhead toss      Shoulder Exercises: Stretch   Other Shoulder Stretches  standing doorway stretching ER/pec and flexion       Modalities   Modalities  Cryotherapy      Cryotherapy   Number Minutes Cryotherapy  10 Minutes    Cryotherapy Location  Shoulder    Type of Cryotherapy  Ice pack      Manual Therapy   Joint Mobilization  inferior Rt GH mobs    Passive ROM  flexion, ER, IR, scaption                 PT Short Term Goals - 03/28/18 1058  PT SHORT TERM GOAL #1   Title  He will be independent with inital HEP     Status  Achieved      PT SHORT TERM GOAL #2   Title  Pt will perform R shoulder flex to 100 degrees seated.     Status  Achieved      PT SHORT TERM GOAL #3   Title  Pt will be able to perform R shoulder abduction to 100 degrees to assist with showering    Status  Achieved        PT Long Term Goals - 03/28/18 1056      PT LONG TERM GOAL #1   Title  He will be able to demo all HEP inclding advanced stabilization and strength without pain.     Status  Achieved      PT LONG TERM GOAL #2   Title  Pt will demo improved R UE strength to >/= 4+/5 to assist with ADLS and iADLS    Status  Achieved      PT LONG TERM GOAL #3   Title  Pt will be able to play basketball x 3 min once cleared by MD without pain    Baseline  can dribble, shoot but has not played an actual game     Status  Achieved      PT LONG TERM GOAL #4   Title  He will be able to tolerate body weight with plank / getting off floor using R arm without pain or  compensation.     Status  Achieved      PT LONG TERM GOAL #5   Title  He will be able to pass basketball and catch without pain.     Baseline  has not done recently , can do in the clinic 2 min light weighted ball     Status  Partially Met            Plan - 03/28/18 1039    Clinical Impression Statement  Patient at the end of his POC.  IT was determined that he will just pick up PT when he gets back to school.  He heas not returned to playing basketball but has been shooting a bit. I with HEP.  Cues needed for scapular position.     PT Next Visit Plan  NA , DC     PT Home Exercise Plan  ice, pendulum, scap retract, cane for AAROM , isometrics , supine scap stab yellow, standing IR/ER red vs yellow     Consulted and Agree with Plan of Care  Patient       Patient will benefit from skilled therapeutic intervention in order to improve the following deficits and impairments:  Postural dysfunction, Decreased scar mobility, Decreased mobility, Decreased coordination, Decreased activity tolerance, Decreased endurance, Decreased range of motion, Decreased strength, Hypomobility, Impaired UE functional use, Impaired flexibility, Increased edema, Decreased safety awareness  Visit Diagnosis: Instability of shoulder joint, unspecified laterality  Abnormal posture  Chronic right shoulder pain  Muscle weakness (generalized)     Problem List Patient Active Problem List   Diagnosis Date Noted  . Shoulder joint instability, right 12/27/2017    PAA,JENNIFER 03/28/2018, 11:14 AM  [place], [place], [address] Phone: [phone]   Fax:  [phone]  Name: Keith Rhodes MRN: [id number] Date of Birth: December 10, 1995  PHYSICAL THERAPY DISCHARGE SUMMARY  Visits from Start of Care: 12  Current functional level  related to goals / functional outcomes: See above, no pain for the most part.  Able to do ADLs,  mobility tasks, use Rt UE for weightbearing, recreation without limitation of pain.    Remaining deficits: Scapular mechanics, muscle endurance and strength , lacks AROM in all planes    Education / Equipment: Posture, HEP, ROM, scapula  Plan: Patient agrees to discharge.  Patient goals were partially met. Patient is being discharged due to being pleased with the current functional level.  ?????   Patient is going back to [place] this weekend.  If MCD resubmitted, he would not have time to complete any more visits.  End of auth period was 03/27/18.    Plans to continue PT locally near [place].  Raeford Razor, PT 03/28/18 11:18 AM Phone: [phone] Fax: [phone]

## 2018-03-29 ENCOUNTER — Ambulatory Visit: Payer: Medicaid Other | Admitting: Physical Therapy

## 2018-03-30 ENCOUNTER — Ambulatory Visit: Payer: Medicaid Other | Admitting: Physical Therapy

## 2020-02-23 ENCOUNTER — Other Ambulatory Visit: Payer: Self-pay

## 2020-02-23 ENCOUNTER — Emergency Department (HOSPITAL_COMMUNITY): Payer: Medicaid Other

## 2020-02-23 ENCOUNTER — Emergency Department (HOSPITAL_COMMUNITY)
Admission: EM | Admit: 2020-02-23 | Discharge: 2020-02-23 | Disposition: A | Discharge status: Home / Self Care | Payer: Medicaid Other | Attending: Emergency Medicine | Admitting: Emergency Medicine

## 2020-02-23 DIAGNOSIS — Y999 Unspecified external cause status: Secondary | ICD-10-CM | POA: Insufficient documentation

## 2020-02-23 DIAGNOSIS — Y92310 Basketball court as the place of occurrence of the external cause: Secondary | ICD-10-CM | POA: Insufficient documentation

## 2020-02-23 DIAGNOSIS — Y9367 Activity, basketball: Secondary | ICD-10-CM | POA: Insufficient documentation

## 2020-02-23 DIAGNOSIS — X501XXA Overexertion from prolonged static or awkward postures, initial encounter: Secondary | ICD-10-CM | POA: Insufficient documentation

## 2020-02-23 DIAGNOSIS — S43005A Unspecified dislocation of left shoulder joint, initial encounter: Secondary | ICD-10-CM | POA: Insufficient documentation

## 2020-02-23 MED ORDER — PROPOFOL 10 MG/ML IV BOLUS
0.5000 mg/kg | Freq: Once | INTRAVENOUS | Status: AC
  Administered 2020-02-23: 34 mg via INTRAVENOUS
  Filled 2020-02-23: qty 20

## 2020-02-23 MED ORDER — PROPOFOL 10 MG/ML IV BOLUS
INTRAVENOUS | Status: AC | PRN
  Administered 2020-02-23 (×4): 40 mg via INTRAVENOUS

## 2020-02-23 NOTE — Progress Notes (Signed)
Orthopedic Tech Progress Note Patient Details:  Keith Rhodes 07-27-1996 [id number]  Ortho Devices Type of Ortho Device: Sling immobilizer Ortho Device/Splint Location: left Ortho Device/Splint Interventions: Application   Post Interventions Patient Tolerated: Well Instructions Provided: Care of device   Saul Fordyce 02/23/2020, 7:06 PM

## 2020-02-23 NOTE — ED Provider Notes (Signed)
[facility]-EMERGENCY DEPT Provider Note   CSN: [id number] Arrival date & time: 02/23/20  1802     History Chief Complaint  Patient presents with   shoulder dislocation    left    Keith Rhodes is a 24 y.o. male.  HPI   Patient presents via EMS with concern for grossly dislocated left shoulder. Patient has a history of prior dislocations, 5 on the right, 1 prior on the left. He notes that he is otherwise healthy. Today, while playing basketball, obtaining a rebound, he felt sudden onset of pain, since that time has had severe sharp pain in the left shoulder, worse with motion. He did not fall, denies other pain or complaints. He received 100 mcg of fentanyl in route, notes that his pain is improved marginally. EMS does not report other injuries, fall, complaints, hemodynamic stability.   Past Medical History:  Diagnosis Date   Shoulder dislocation    Rightx4    Patient Active Problem List   Diagnosis Date Noted   Shoulder joint instability, right 12/27/2017    Past Surgical History:  Procedure Laterality Date   SHOULDER ARTHROSCOPY WITH LABRAL REPAIR Right 12/27/2017   Procedure: Arthroscopic right shoulder labrum repair;  Surgeon: Yolonda Kida, MD;  Location: [facility];  Service: Orthopedics;  Laterality: Right;       No family history on file.  Social History   Tobacco Use   Smoking status: Never Smoker   Smokeless tobacco: Never Used  Vaping Use   Vaping Use: Never used  Substance Use Topics   Alcohol use: No   Drug use: Never    Home Medications Prior to Admission medications   Medication Sig Start Date End Date Taking? Authorizing Provider  acetaminophen (TYLENOL) 325 MG tablet Take 650 mg by mouth every 6 (six) hours as needed for mild pain.    [provider]  ondansetron (ZOFRAN ODT) 4 MG disintegrating tablet Take 1 tablet (4 mg total) by mouth every 8 (eight) hours as  needed for nausea or vomiting. Patient not taking: Reported on 01/30/2018 12/27/17   Yolonda Kida, MD  OVER THE COUNTER MEDICATION Bone strength vitamins    [provider]    Allergies    Patient has no known allergies.  Review of Systems   Review of Systems  Constitutional:       Per HPI, otherwise negative  HENT:       Per HPI, otherwise negative  Respiratory:       Per HPI, otherwise negative  Cardiovascular:       Per HPI, otherwise negative  Gastrointestinal: Negative for vomiting.  Endocrine:       Negative aside from HPI  Genitourinary:       Neg aside from HPI   Musculoskeletal:       Per HPI, otherwise negative  Skin: Negative.   Neurological: Negative for syncope.    Physical Exam Updated Vital Signs BP 133/78    Pulse (!) 57    Temp 98.3 F (36.8 C) (Oral)    Resp 11    Ht 5\' 10"  (1.778 m)    Wt 68 kg    SpO2 100%    BMI 21.52 kg/m   Physical Exam Vitals and nursing note reviewed.  Constitutional:      General: He is not in acute distress.    Appearance: He is well-developed.  HENT:     Head: Normocephalic and atraumatic.  Eyes:  Conjunctiva/sclera: Conjunctivae normal.  Cardiovascular:     Rate and Rhythm: Normal rate and regular rhythm.  Pulmonary:     Effort: Pulmonary effort is normal. No respiratory distress.     Breath sounds: No stridor.  Abdominal:     General: There is no distension.  Musculoskeletal:     Right shoulder: Normal.     Left shoulder: Deformity present.     Left elbow: Normal.     Left wrist: Normal.       Arms:  Skin:    General: Skin is warm and dry.  Neurological:     Mental Status: He is alert and oriented to person, place, and time.      ED Results / Procedures / Treatments    Radiology - post reduction  DG Shoulder Left Portable  Result Date: 02/23/2020 CLINICAL DATA:  Status post reduction. EXAM: LEFT SHOULDER COMPARISON:  06/12/2016 FINDINGS: No previa deck shin radiographs were  available. There is no acute displaced fracture or dislocation. There are no significant degenerative changes. A Hill-Sachs lesion is noted. IMPRESSION: No acute displaced fracture or dislocation. Electronically Signed   By: Katherine Mantle M.D.   On: 02/23/2020 19:23    Procedures .Sedation  Date/Time: 02/23/2020 7:05 PM Performed by: Gerhard Munch, MD Authorized by: Gerhard Munch, MD   Consent:    Consent obtained:  Verbal   Consent given by:  Patient   Risks discussed:  Allergic reaction, dysrhythmia, inadequate sedation, nausea, prolonged hypoxia resulting in organ damage, prolonged sedation necessitating reversal, respiratory compromise necessitating ventilatory assistance and intubation and vomiting   Alternatives discussed:  Analgesia without sedation, anxiolysis and regional anesthesia Universal protocol:    Procedure explained and questions answered to patient or proxy's satisfaction: yes     Relevant documents present and verified: yes     Required blood products, implants, devices, and special equipment available: yes     Site/side marked: yes     Immediately prior to procedure a time out was called: yes     Patient identity confirmation method:  Verbally with patient Indications:    Procedure necessitating sedation performed by:  Physician performing sedation Pre-sedation assessment:    Time since last food or drink:  4   ASA classification: class 1 - normal, healthy patient     Neck mobility: normal     Mouth opening:  3 or more finger widths   Thyromental distance:  4 finger widths   Mallampati score:  I - soft palate, uvula, fauces, pillars visible   Pre-sedation assessments completed and reviewed: airway patency, cardiovascular function, hydration status, mental status, nausea/vomiting, pain level, respiratory function and temperature     Pre-sedation assessment completed:  02/23/2020 6:30 PM Immediate pre-procedure details:    Reassessment: Patient reassessed  immediately prior to procedure     Reviewed: vital signs, relevant labs/tests and NPO status     Verified: bag valve mask available, emergency equipment available, intubation equipment available, IV patency confirmed, oxygen available and suction available   Procedure details (see MAR for exact dosages):    Preoxygenation:  Nasal cannula   Sedation:  Propofol   Intended level of sedation: deep   Intra-procedure monitoring:  Blood pressure monitoring, cardiac monitor, continuous pulse oximetry, frequent LOC assessments, frequent vital sign checks and continuous capnometry   Intra-procedure events: none     Total Provider sedation time (minutes):  20 Post-procedure details:    Post-sedation assessment completed:  02/23/2020 7:06 PM   Attendance: Constant attendance by  certified staff until patient recovered     Recovery: Patient returned to pre-procedure baseline     Post-sedation assessments completed and reviewed: airway patency, cardiovascular function, hydration status, mental status, nausea/vomiting, pain level, respiratory function and temperature     Patient is stable for discharge or admission: yes     Patient tolerance:  Tolerated well, no immediate complications .Ortho Injury Treatment  Date/Time: 02/23/2020 6:50 PM Performed by: Gerhard Munch, MD Authorized by: Gerhard Munch, MD   Consent:    Consent obtained:  Verbal   Consent given by:  Patient   Risks discussed:  Fracture, irreducible dislocation, stiffness and restricted joint movement   Alternatives discussed:  No treatment and alternative treatment Universal protocol:    Procedure explained and questions answered to patient or proxy's satisfaction: yes     Relevant documents present and verified: yes     Required blood products, implants, devices, and special equipment available: yes     Site/side marked: yes     Immediately prior to procedure a time out was called: yes     Patient identity confirmed:  Verbally  with patientInjury location: shoulder Location details: left shoulder Injury type: dislocation Dislocation type: anterior Chronicity: recurrent Pre-procedure neurovascular assessment: neurovascularly intact Pre-procedure distal perfusion: normal Pre-procedure neurological function: normal Pre-procedure range of motion: reduced  Anesthesia: Local anesthesia used: no  Patient sedated: Yes. Refer to sedation procedure documentation for details of sedation. Manipulation performed: yes Reduction method: Stimson maneuver Reduction successful: yes X-ray confirmed reduction: yes Immobilization: sling Supplies used: cotton padding Post-procedure neurovascular assessment: post-procedure neurovascularly intact Post-procedure distal perfusion: normal Post-procedure neurological function: normal Post-procedure range of motion: improved Patient tolerance: patient tolerated the procedure well with no immediate complications    (including critical care time)  Medications Ordered in ED Medications  propofol (DIPRIVAN) 10 mg/mL bolus/IV push 34 mg (34 mg Intravenous Given 02/23/20 1842)  propofol (DIPRIVAN) 10 mg/mL bolus/IV push (40 mg Intravenous Given 02/23/20 1856)    ED Course  I have reviewed the triage vital signs and the nursing notes.  Pertinent labs & imaging results that were available during my care of the patient were reviewed by me and considered in my medical decision making (see chart for details).    7:03 PM Patient awakening from sedation - no complaints.  Generally healthy male presents after sustaining a shoulder dislocation, left. Patient had conscious sedation, reduction, successfully completed without complication. Without other injuries, complaints, and after appropriate treatment, the patient is appropriate for discharge, following a period of monitoring following return to interactivity following conscious sedation.  MDM Number of Diagnoses or Management  Options   Amount and/or Complexity of Data Reviewed Tests in the radiology section of CPT: reviewed Obtain history from someone other than the patient: yes Independent visualization of images, tracings, or specimens: yes  Risk of Complications, Morbidity, and/or Mortality Presenting problems: high Diagnostic procedures: high Management options: high  Critical Care Total time providing critical care: < 30 minutes  Patient Progress Patient progress: improved   Final Clinical Impression(s) / ED Diagnoses Final diagnoses:  Dislocation of left shoulder joint, initial encounter     Gerhard Munch, MD 02/23/20 1956

## 2020-02-23 NOTE — ED Triage Notes (Signed)
[employer] gems, patient playing basketball, doing a layup and felt left shoulder come out of place. Patient says is this 6th time he has had same injury. 24 right hand, fluid bolus running. 100 fentanyl given en route . Patient reports pain 10/10

## 2020-02-23 NOTE — ED Notes (Signed)
Electronic consent obtained and witnessed by this RN 

## 2020-02-23 NOTE — Discharge Instructions (Addendum)
Please be sure to keep your sling in place over the next 5 to 7 days when active.  Schedule follow-up with our orthopedic physician or return here for concerning changes in your condition.

## 2020-02-24 ENCOUNTER — Encounter (HOSPITAL_COMMUNITY): Payer: Self-pay | Admitting: Emergency Medicine

## 2020-04-09 ENCOUNTER — Other Ambulatory Visit: Payer: Medicaid Other

## 2020-04-09 ENCOUNTER — Other Ambulatory Visit: Payer: Self-pay

## 2020-04-09 DIAGNOSIS — Z20822 Contact with and (suspected) exposure to covid-19: Secondary | ICD-10-CM

## 2020-04-12 LAB — NOVEL CORONAVIRUS, NAA: SARS-CoV-2, NAA: NOT DETECTED

## 2020-04-18 ENCOUNTER — Other Ambulatory Visit: Payer: Self-pay

## 2020-05-15 ENCOUNTER — Other Ambulatory Visit: Payer: Self-pay

## 2020-05-15 ENCOUNTER — Ambulatory Visit
Admission: EM | Admit: 2020-05-15 | Discharge: 2020-05-15 | Disposition: A | Discharge status: Home / Self Care | Payer: Self-pay | Attending: Emergency Medicine | Admitting: Emergency Medicine

## 2020-05-15 DIAGNOSIS — R112 Nausea with vomiting, unspecified: Secondary | ICD-10-CM

## 2020-05-15 MED ORDER — ONDANSETRON 4 MG PO TBDP: 4.0000 mg | ORAL_TABLET | Freq: Three times a day (TID) | ORAL | 0 refills | Status: AC | PRN

## 2020-05-15 NOTE — ED Triage Notes (Signed)
Pt c/o vomiting, weakness, restless, and center abdominal cramping since Tuesday. States needs a school note to return.

## 2020-05-15 NOTE — ED Provider Notes (Signed)
[facility]    CSN: [id number] Arrival date & time: 05/15/20  0848      History   Chief Complaint Chief Complaint  Patient presents with  . Emesis    HPI Keith Rhodes is a 24 y.o. male  Presenting for nausea, vomiting, fatigue since Tuesday.  States that he has slowly improved by sleeping more, avoiding reflux triggers.  No diarrhea, fever.  States he underwent Covid testing: Negative.  Needing note to return to school.  Past Medical History:  Diagnosis Date  . Shoulder dislocation    Rightx4    Patient Active Problem List   Diagnosis Date Noted  . Shoulder joint instability, right 12/27/2017    Past Surgical History:  Procedure Laterality Date  . SHOULDER ARTHROSCOPY WITH LABRAL REPAIR Right 12/27/2017   Procedure: Arthroscopic right shoulder labrum repair;  Surgeon: Yolonda Kida, MD;  Location: [facility];  Service: Orthopedics;  Laterality: Right;       Home Medications    Prior to Admission medications   Medication Sig Start Date End Date Taking? Authorizing Provider  ondansetron (ZOFRAN ODT) 4 MG disintegrating tablet Take 1 tablet (4 mg total) by mouth every 8 (eight) hours as needed for nausea or vomiting. 05/15/20   Hall-Potvin, [place], PA-C    Family History History reviewed. No pertinent family history.  Social History Social History   Tobacco Use  . Smoking status: Never Smoker  . Smokeless tobacco: Never Used  Vaping Use  . Vaping Use: Never used  Substance Use Topics  . Alcohol use: No  . Drug use: Never     Allergies   Patient has no known allergies.   Review of Systems As per HPI   Physical Exam Triage Vital Signs ED Triage Vitals  Enc Vitals Group     BP      Pulse      Resp      Temp      Temp src      SpO2      Weight      Height      Head Circumference      Peak Flow      Pain Score      Pain Loc      Pain Edu?      Excl. in GC?    No data found.  Updated  Vital Signs BP 120/80 (BP Location: Left Arm)   Pulse 61   Temp 98 F (36.7 C) (Oral)   Resp 18   SpO2 95%   Visual Acuity Right Eye Distance:   Left Eye Distance:   Bilateral Distance:    Right Eye Near:   Left Eye Near:    Bilateral Near:     Physical Exam Constitutional:      General: He is not in acute distress. HENT:     Head: Normocephalic and atraumatic.  Eyes:     General: No scleral icterus.    Pupils: Pupils are equal, round, and reactive to light.  Cardiovascular:     Rate and Rhythm: Normal rate.  Pulmonary:     Effort: Pulmonary effort is normal. No respiratory distress.     Breath sounds: No wheezing.  Abdominal:     General: Abdomen is flat. Bowel sounds are normal.     Palpations: Abdomen is soft. There is no hepatomegaly or splenomegaly.     Tenderness: There is no abdominal tenderness. There is no guarding or rebound. Negative signs  include Murphy's sign, Rovsing's sign and McBurney's sign.  Skin:    Coloration: Skin is not jaundiced or pale.  Neurological:     Mental Status: He is alert and oriented to person, place, and time.      UC Treatments / Results  Labs (all labs ordered are listed, but only abnormal results are displayed) Labs Reviewed - No data to display  EKG   Radiology No results found.  Procedures Procedures (including critical care time)  Medications Ordered in UC Medications - No data to display  Initial Impression / Assessment and Plan / UC Course  I have reviewed the triage vital signs and the nursing notes.  Pertinent labs & imaging results that were available during my care of the patient were reviewed by me and considered in my medical decision making (see chart for details).     Afebrile, nontoxic.  Already had Covid test done: Negative.  School note provided.  Return precautions discussed, pt verbalized understanding and is agreeable to plan. Final Clinical Impressions(s) / UC Diagnoses   Final diagnoses:    Nausea and vomiting, intractability of vomiting not specified, unspecified vomiting type     Discharge Instructions     Zofran for nausea.    ED Prescriptions    Medication Sig Dispense Auth. Provider   ondansetron (ZOFRAN ODT) 4 MG disintegrating tablet Take 1 tablet (4 mg total) by mouth every 8 (eight) hours as needed for nausea or vomiting. 21 tablet Hall-Potvin, [place], PA-C     PDMP not reviewed this encounter.   Hall-Potvin, [place], [place] 05/15/20 [phone]

## 2020-05-15 NOTE — Discharge Instructions (Addendum)
Zofran for nausea

## 2020-10-27 ENCOUNTER — Other Ambulatory Visit: Payer: Self-pay

## 2020-10-27 ENCOUNTER — Emergency Department (HOSPITAL_COMMUNITY): Payer: Self-pay

## 2020-10-27 ENCOUNTER — Emergency Department (HOSPITAL_COMMUNITY)
Admission: EM | Admit: 2020-10-27 | Discharge: 2020-10-28 | Disposition: A | Discharge status: Home / Self Care | Payer: Self-pay | Attending: Emergency Medicine | Admitting: Emergency Medicine

## 2020-10-27 ENCOUNTER — Encounter (HOSPITAL_COMMUNITY): Payer: Self-pay

## 2020-10-27 DIAGNOSIS — S43005A Unspecified dislocation of left shoulder joint, initial encounter: Secondary | ICD-10-CM | POA: Insufficient documentation

## 2020-10-27 DIAGNOSIS — X501XXA Overexertion from prolonged static or awkward postures, initial encounter: Secondary | ICD-10-CM | POA: Insufficient documentation

## 2020-10-27 DIAGNOSIS — Y92009 Unspecified place in unspecified non-institutional (private) residence as the place of occurrence of the external cause: Secondary | ICD-10-CM | POA: Insufficient documentation

## 2020-10-27 MED ORDER — LORAZEPAM 2 MG/ML IJ SOLN
5.0000 mg | Freq: Once | INTRAMUSCULAR | Status: AC
  Administered 2020-10-27: 5 mg via INTRAVENOUS
  Filled 2020-10-27: qty 3

## 2020-10-27 MED ORDER — FENTANYL CITRATE (PF) 100 MCG/2ML IJ SOLN
50.0000 ug | Freq: Once | INTRAMUSCULAR | Status: AC
  Administered 2020-10-27: 50 ug via INTRAVENOUS
  Filled 2020-10-27: qty 2

## 2020-10-27 MED ORDER — LIDOCAINE HCL (PF) 1 % IJ SOLN
10.0000 mg | Freq: Once | INTRAMUSCULAR | Status: AC
  Administered 2020-10-27: 10 mg
  Filled 2020-10-27: qty 30

## 2020-10-27 MED ORDER — SODIUM CHLORIDE 0.9 % IV BOLUS: 1000.0000 mL | Freq: Once | INTRAVENOUS | Status: DC

## 2020-10-27 NOTE — ED Notes (Signed)
Entered room to hang IVF, iv catheter out of vein.  Dr.Yao aware.  Pt awake and alert, instructed to call family to pick him up for discharge.

## 2020-10-27 NOTE — ED Notes (Addendum)
Reduction successful, xray ordered for post reduction placement.

## 2020-10-27 NOTE — ED Triage Notes (Signed)
Pt at home in a hurry to get ready for work and reached up to grab something and states he moved to quickly and heard a pop sound.  Pt states he has dislocated his shoulder multiple times in the past due to basketball and other physical activity.  Pt reported to EMS his pain was 7/10 prior to fentanyl in route, fentanyl brought pain down to a 4/10.

## 2020-10-27 NOTE — Progress Notes (Signed)
Orthopedic Tech Progress Note Patient Details:  Keith Rhodes June 07, 1996 [id number]  Ortho Devices Type of Ortho Device: Shoulder immobilizer Ortho Device/Splint Location: left dislocation Ortho Device/Splint Interventions: Application   Post Interventions Patient Tolerated: Well Instructions Provided: Care of device   Saul Fordyce 10/27/2020, 8:57 PM

## 2020-10-27 NOTE — Discharge Instructions (Addendum)
We have reduced your shoulder dislocation.  I placed you in a sling please wear.  I recommend over-the-counter pain medications like ibuprofen and/or Tylenol every 6 hours as needed please follow dosing on back of bottle.  I want you to follow-up with orthopedic surgery for further evaluation of your dislocation.  Come back to the emergency department if you develop chest pain, shortness of breath, severe abdominal pain, uncontrolled nausea, vomiting, diarrhea.

## 2020-10-27 NOTE — ED Provider Notes (Signed)
[facility]-EMERGENCY DEPT Provider Note   CSN: [id number] Arrival date & time: 10/27/20  1914     History Chief Complaint  Patient presents with  . Dislocation    Left shoulder     Keith Rhodes is a 25 y.o. male.  HPI   Patient with significant medical history of previous shoulder dislocations presents to the emergency department with chief complaint of dislocated shoulder.  He endorses that he was out trying to reach for something and his shoulder gave out, he had severe pain and has been unable to move his shoulder.  He denies paresthesia or weakness in his hand, states that he has worsening pain when he tries to move his shoulder.  He denies any alleviating factors.  Patient endorses that he has had his shoulders dislocations in the past and generally they have to sedate him to get it back in place.  Patient denies headaches, fevers, chills, chest pain, shortness of breath, abdominal pain, nausea, vomiting, diarrhea, worsening pedal edema.  Past Medical History:  Diagnosis Date  . Shoulder dislocation    Rightx4    Patient Active Problem List   Diagnosis Date Noted  . Shoulder joint instability, right 12/27/2017    Past Surgical History:  Procedure Laterality Date  . SHOULDER ARTHROSCOPY WITH LABRAL REPAIR Right 12/27/2017   Procedure: Arthroscopic right shoulder labrum repair;  Surgeon: Yolonda Kida, MD;  Location: [facility];  Service: Orthopedics;  Laterality: Right;       History reviewed. No pertinent family history.  Social History   Tobacco Use  . Smoking status: Never Smoker  . Smokeless tobacco: Never Used  Vaping Use  . Vaping Use: Never used  Substance Use Topics  . Alcohol use: No  . Drug use: Never    Home Medications Prior to Admission medications   Medication Sig Start Date End Date Taking? Authorizing Provider  ondansetron (ZOFRAN ODT) 4 MG disintegrating tablet Take 1 tablet (4 mg total)  by mouth every 8 (eight) hours as needed for nausea or vomiting. 05/15/20   Hall-Potvin, [place], PA-C    Allergies    Patient has no known allergies.  Review of Systems   Review of Systems  Constitutional: Negative for chills and fever.  HENT: Negative for congestion.   Respiratory: Negative for shortness of breath.   Cardiovascular: Negative for chest pain.  Gastrointestinal: Negative for abdominal pain.  Genitourinary: Negative for enuresis.  Musculoskeletal: Negative for back pain.       Left shoulder pain.  Skin: Negative for rash.  Neurological: Negative for dizziness and headaches.  Hematological: Does not bruise/bleed easily.    Physical Exam Updated Vital Signs BP 109/77   Pulse 71   Temp 98.4 F (36.9 C) (Oral)   Resp 13   Ht 5\' 11"  (1.803 m)   Wt 65.8 kg   SpO2 100%   BMI 20.22 kg/m   Physical Exam Vitals and nursing note reviewed.  Constitutional:      General: He is not in acute distress.    Appearance: Normal appearance. He is not ill-appearing or diaphoretic.  HENT:     Head: Normocephalic and atraumatic.     Nose: No congestion.  Eyes:     Conjunctiva/sclera: Conjunctivae normal.  Cardiovascular:     Rate and Rhythm: Normal rate and regular rhythm.  Pulmonary:     Effort: Pulmonary effort is normal.  Musculoskeletal:        General: Tenderness and deformity present.  Cervical back: Neck supple.     Right lower leg: No edema.     Left lower leg: No edema.     Comments: Patient's left shoulder is visualized he has a squared deformity present, there is a noted gap between the head of his humerus and his scapula, he had full range of motion at his fingers, wrist, elbow, unable to articulate at the shoulder.  Neurovascular fully intact.  Skin:    General: Skin is warm and dry.     Coloration: Skin is not jaundiced or pale.  Neurological:     Mental Status: He is alert and oriented to person, place, and time.  Psychiatric:        Mood and  Affect: Mood normal.     ED Results / Procedures / Treatments   Labs (all labs ordered are listed, but only abnormal results are displayed) Labs Reviewed - No data to display  EKG None  Radiology DG Shoulder Left Portable  Result Date: 10/27/2020 CLINICAL DATA:  Reduction EXAM: LEFT SHOULDER COMPARISON:  10/27/2020 FINDINGS: AC joint is intact. Interval reduction of previously noted shoulder dislocation. Normal alignment. No fracture is seen IMPRESSION: Interval reduction of shoulder dislocation. Electronically Signed   By: Jasmine Pang M.D.   On: 10/27/2020 20:55   DG Shoulder Left Portable  Result Date: 10/27/2020 CLINICAL DATA:  Shoulder dislocation EXAM: LEFT SHOULDER COMPARISON:  02/23/2020 FINDINGS: Anterior inferior dislocation of the humeral head with respect to the glenoid fossa. No displaced fracture is seen. IMPRESSION: Anterior inferior dislocation of the left humeral head. Electronically Signed   By: Jasmine Pang M.D.   On: 10/27/2020 19:51    Procedures Procedures   Medications Ordered in ED Medications  sodium chloride 0.9 % bolus 1,000 mL (has no administration in time range)  LORazepam (ATIVAN) injection 5 mg (5 mg Intravenous Given 10/27/20 2000)  fentaNYL (SUBLIMAZE) injection 50 mcg (50 mcg Intravenous Given 10/27/20 2019)  lidocaine (PF) (XYLOCAINE) 1 % injection 10 mg (10 mg Other Given by Other 10/27/20 2024)    ED Course  I have reviewed the triage vital signs and the nursing notes.  Pertinent labs & imaging results that were available during my care of the patient were reviewed by me and considered in my medical decision making (see chart for details).    MDM Rules/Calculators/A&P                         Initial impression-patient presents with dislocation of her left shoulder.  He is alert, does not appear in acute distress, vital signs reassuring.  Will obtain imaging, and provide patient with pain medications and recommend realignment of  shoulder.  Work-up-portable x-ray of left shoulder reveals anterior inferior dislocation of the left humeral head.  Reassessment recommended reduction of shoulder, patient was agreeable to this.  With the help of Mardella Layman lended PA-C patient shoulder was successfully reduced, neurovascular is fully intact.  Will obtain repeat x-ray of left shoulder and placed in a shoulder immobilizer.   Repeat x-ray shows reduced shoulder without signs of fractures.   Patient reassessed and has no complaints at this time.  Patient is somnolent but is arousable, maintaining his airways without difficulty, I suspect this secondary from Ativan, will let patient metabolize before sending home.  Spoke with patient's guardian updated on situation she will pick up the patient when patient is ready for discharge.  Rule out- I have low suspicion for septic arthritis as patient  denies IV drug use, skin exam was performed no erythematous, edematous, warm joints noted on exam. low suspicion for ligament or tendon damage as area was palpated no gross defects noted, they had full range of motion in left shoulder.  Low suspicion for compartment syndrome as area was palpated it was soft to the touch, neurovascular fully intact.  Plan-patient had a dislocated left shoulder and was reduced, will place patient in a immobilizer and have him follow-up with Ortho for further evaluation.   Due to shift change patient will be handed off to Dr. Silverio Lay he was provided HPI, current work-up, likely disposition.  Long as patient becomes more alert patient can be sent home to follow-up with orthopedic surgery for further evaluation.     Final Clinical Impression(s) / ED Diagnoses Final diagnoses:  Dislocation of left shoulder joint, initial encounter    Rx / DC Orders ED Discharge Orders    None       Barnie Del 10/27/20 2157    Charlynne Pander, MD 10/28/20 1555

## 2020-10-27 NOTE — ED Notes (Signed)
Providers at bedside attempting to reduce left shoulder.  Pt was given 5mg  IV ativan prior and of fentanyl.

## 2020-10-27 NOTE — ED Provider Notes (Signed)
Reduction of dislocation  Date/Time: 10/27/2020 8:38 PM Performed by: Maxwell Caul, PA-C Authorized by: Maxwell Caul, PA-C  Consent: Verbal consent obtained. Risks and benefits: risks, benefits and alternatives were discussed Consent given by: patient Patient understanding: patient states understanding of the procedure being performed Patient consent: the patient's understanding of the procedure matches consent given Procedure consent: procedure consent matches procedure scheduled Relevant documents: relevant documents present and verified Test results: test results available and properly labeled Site marked: the operative site was marked Imaging studies: imaging studies available Patient identity confirmed: verbally with patient Time out: Immediately prior to procedure a "time out" was called to verify the correct patient, procedure, equipment, support staff and site/side marked as required. Preparation: Patient was prepped and draped in the usual sterile fashion. Local anesthesia used: no  Anesthesia: Local anesthesia used: no  Sedation: Patient sedated: no  Patient tolerance: patient tolerated the procedure well with no immediate complications Comments: Patient with good distal cap refill and pulses after reduction.        Maxwell Caul, PA-C 10/27/20 2039    Charlynne Pander, MD 10/28/20 [phone]

## 2020-10-28 NOTE — ED Notes (Signed)
Pt trying to get in touch with his mother on the phone after this RN reviewed discharge paper work with pt.

## 2020-10-28 NOTE — ED Notes (Signed)
Pt states wants to stay in the bed and not go to lobby.  Explained to pt most likely will need to wait in lobby for his ride home, pt picks up cell phone to call his ride again.

## 2020-10-28 NOTE — ED Notes (Signed)
Paper scrub top given to pt, pt's shirt from home was cut by EMS.  RN assisted pt with getting scrub top on.

## 2021-06-29 ENCOUNTER — Other Ambulatory Visit (HOSPITAL_COMMUNITY): Payer: Self-pay

## 2021-08-12 IMAGING — DX DG SHOULDER 1V*L*
2 series · 2 of 2 positions shown · non-contrast
Comparison: 02/23/2020

CLINICAL DATA: Shoulder dislocation

EXAM:
LEFT SHOULDER

[shoulder obl]
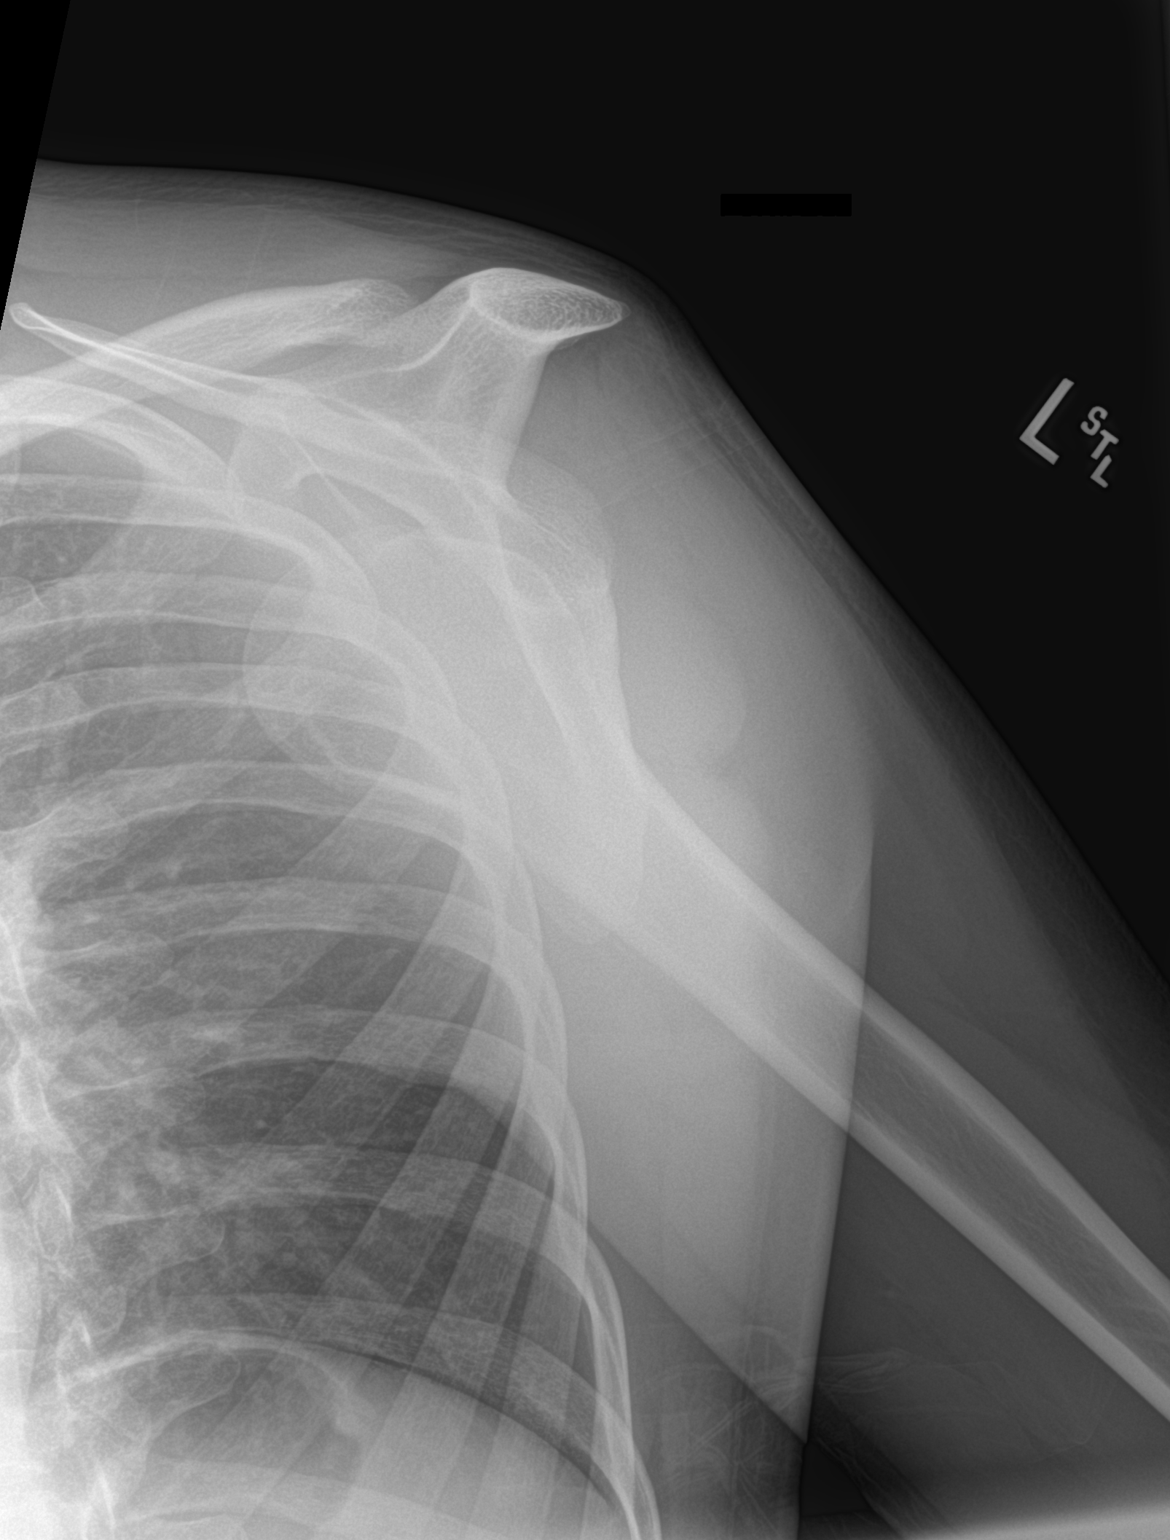

[shoulder ap]
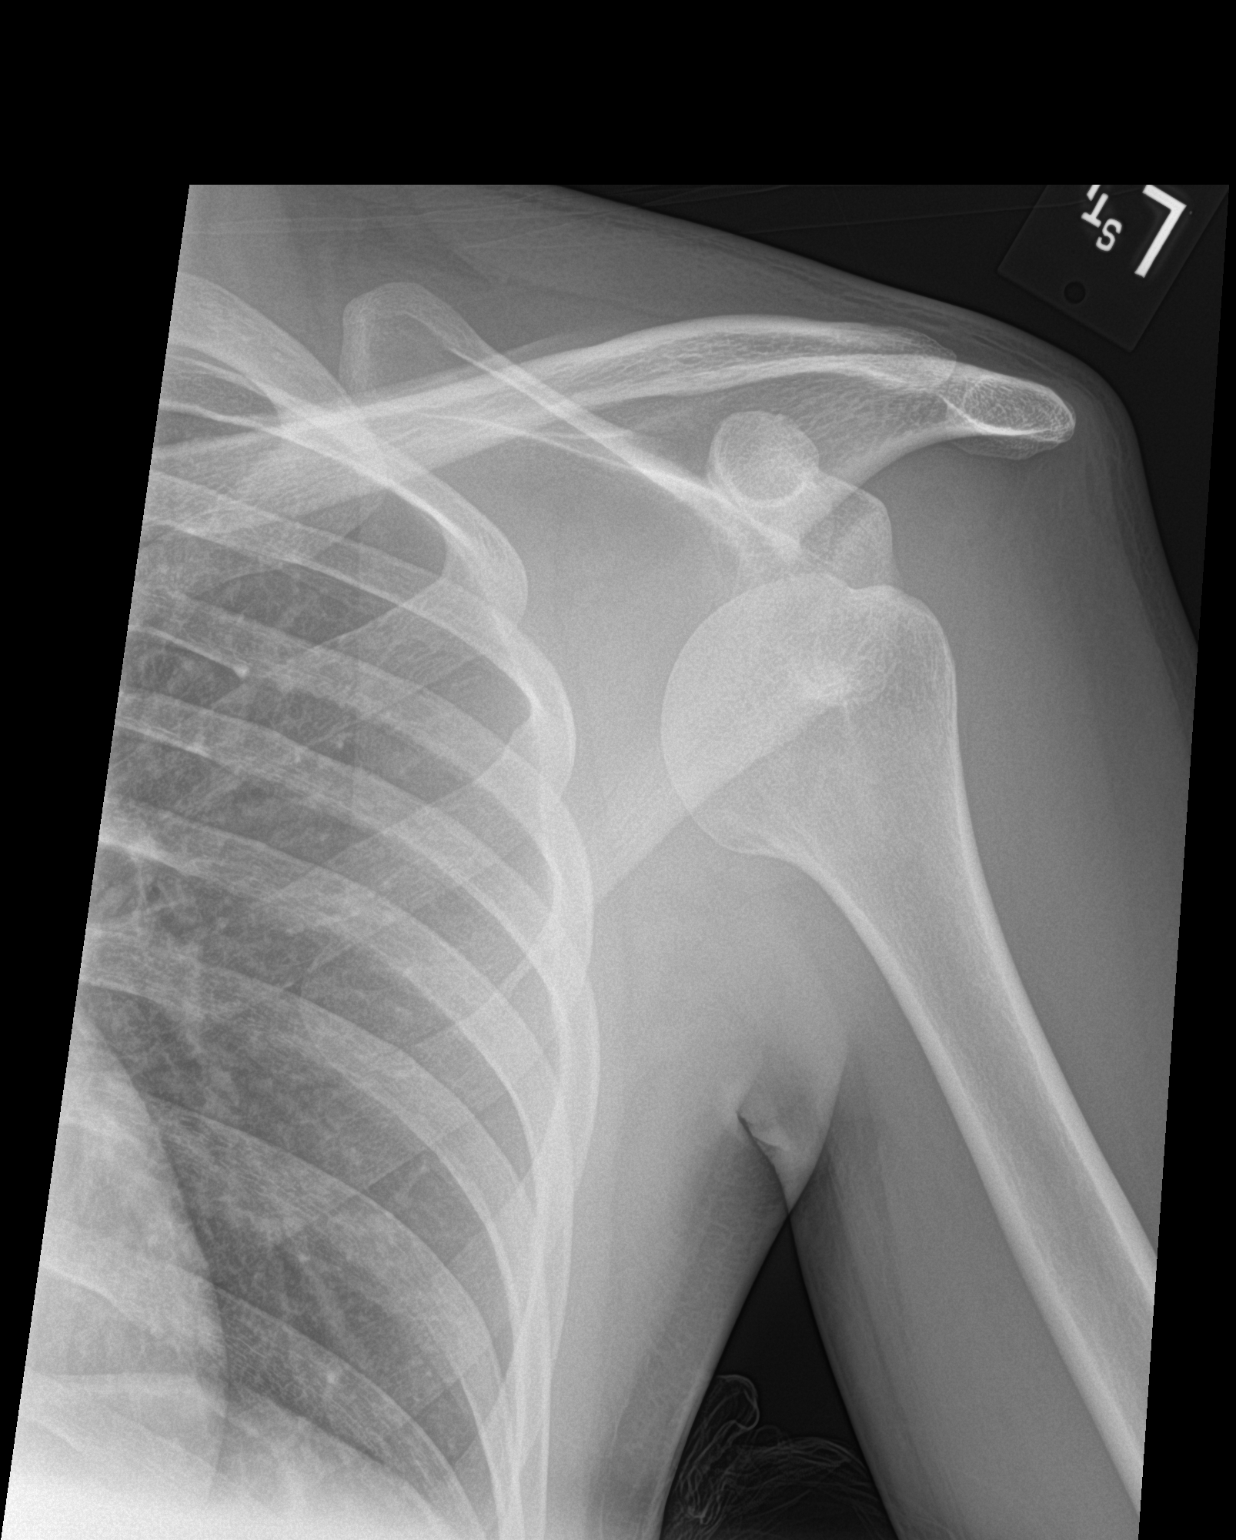

[2 of 2 positions shown; findings below may reference images not displayed]

FINDINGS: Anterior inferior dislocation of the humeral head with respect to
the glenoid fossa. No displaced fracture is seen.
IMPRESSION: Anterior inferior dislocation of the left humeral head.

## 2021-08-12 IMAGING — DX DG SHOULDER 1V*L*
2 series · 2 of 2 positions shown · non-contrast
Comparison: 10/27/2020

CLINICAL DATA: Reduction

EXAM:
LEFT SHOULDER

[shoulder ap]
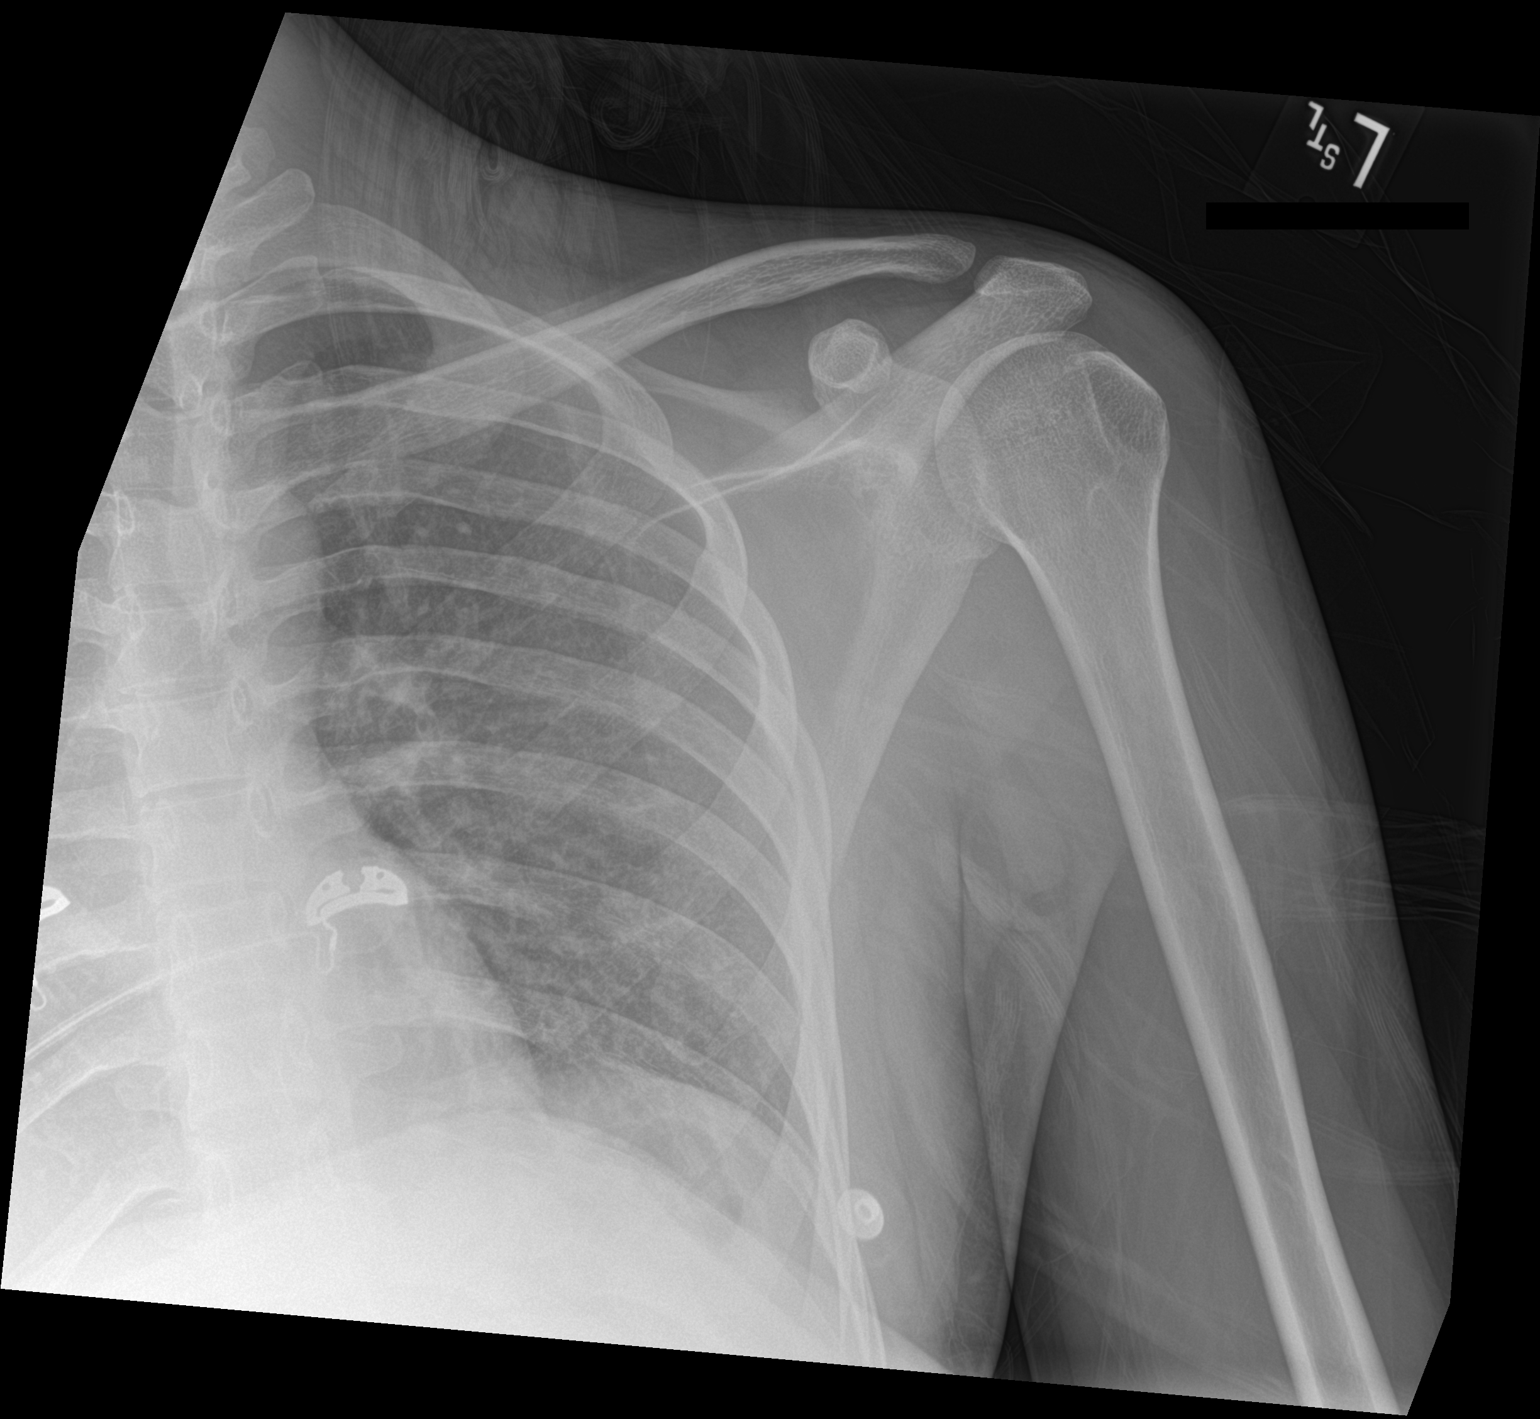

[shoulder obl]
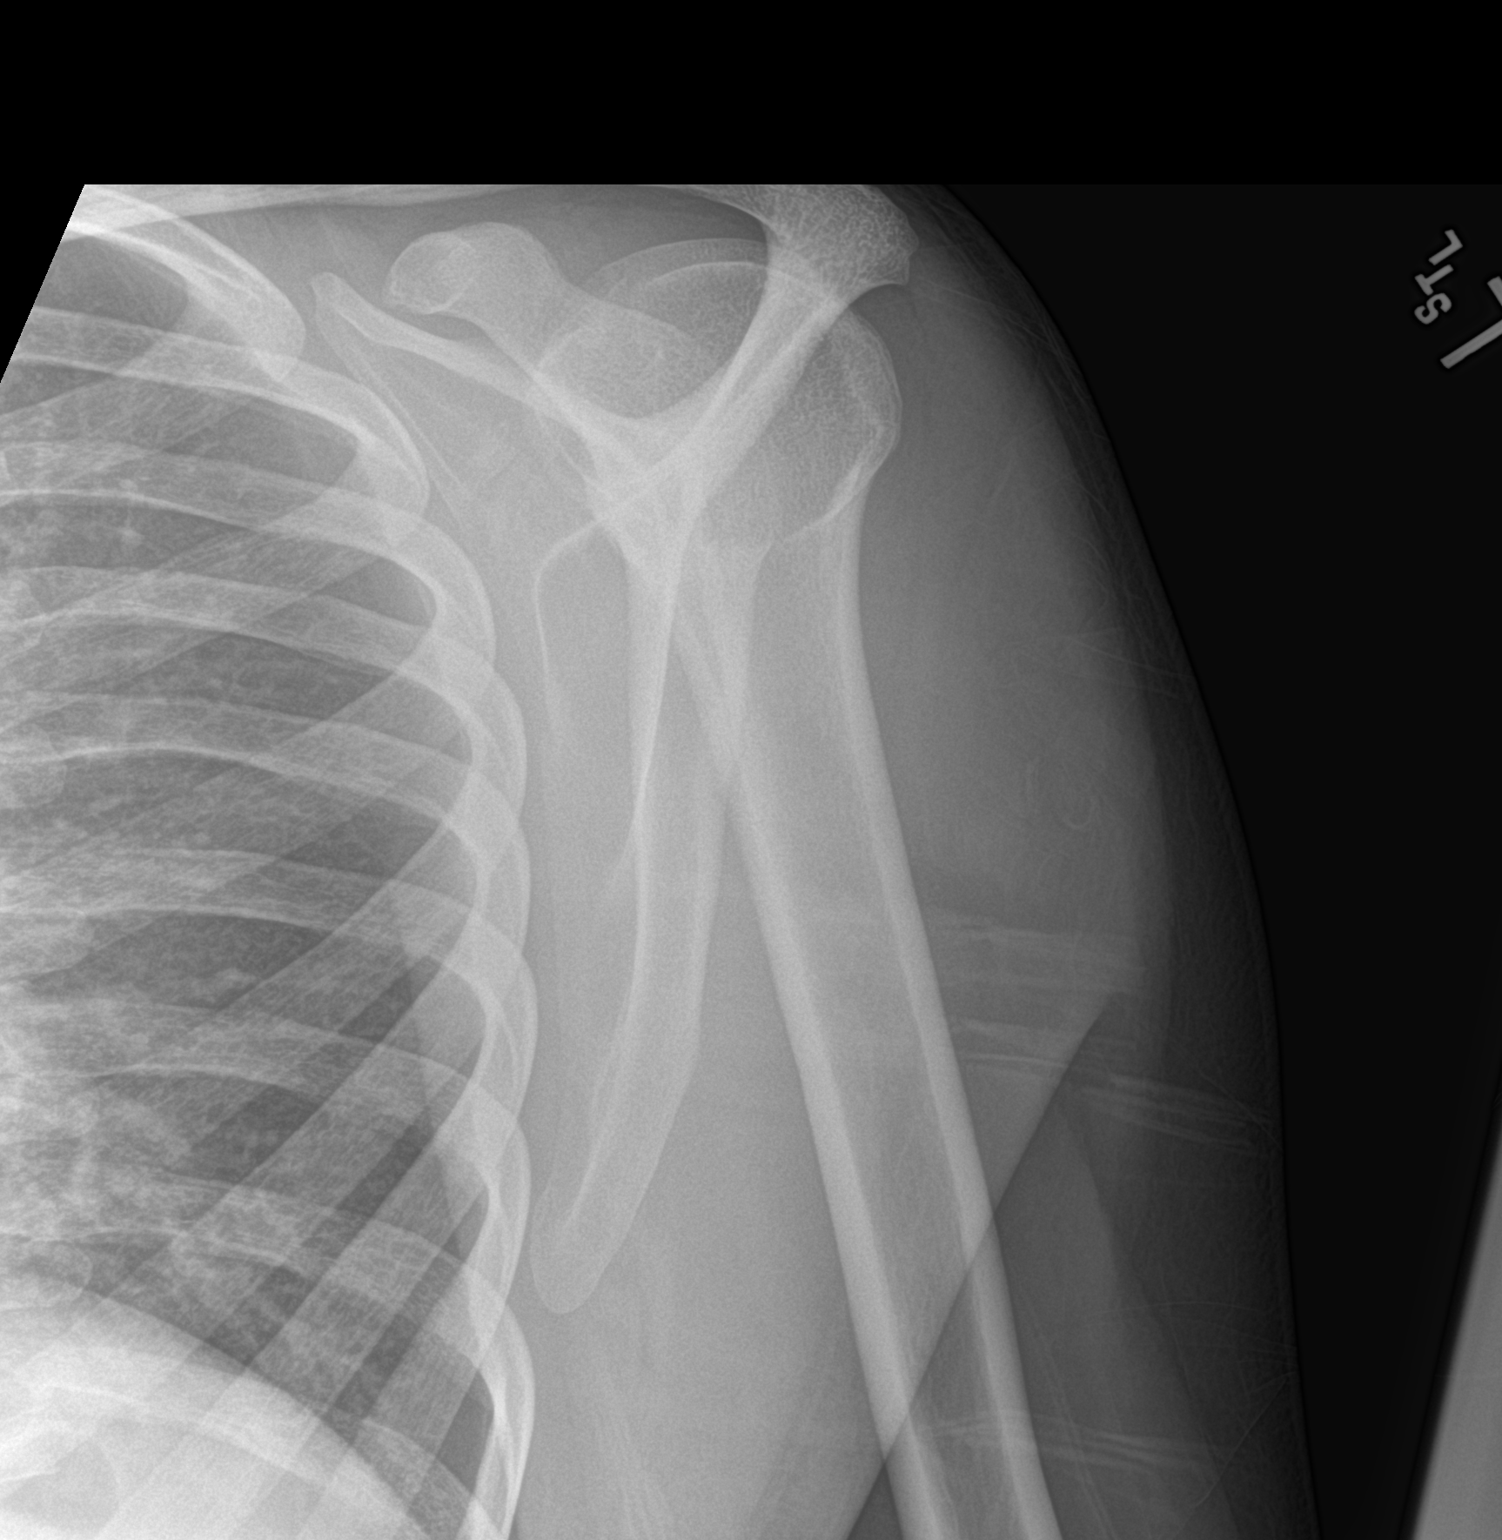

[2 of 2 positions shown; findings below may reference images not displayed]

FINDINGS: AC joint is intact. Interval reduction of previously noted shoulder
dislocation. Normal alignment. No fracture is seen
IMPRESSION: Interval reduction of shoulder dislocation.

## 2022-01-12 ENCOUNTER — Other Ambulatory Visit (HOSPITAL_COMMUNITY)
Admission: RE | Admit: 2022-01-12 | Discharge: 2022-01-12 | Disposition: A | Discharge status: Home / Self Care | Payer: Medicaid Other | Source: Ambulatory Visit | Attending: Family | Admitting: Family

## 2022-01-12 DIAGNOSIS — N898 Other specified noninflammatory disorders of vagina: Secondary | ICD-10-CM | POA: Insufficient documentation

## 2022-01-12 DIAGNOSIS — R300 Dysuria: Secondary | ICD-10-CM | POA: Insufficient documentation

## 2022-04-29 ENCOUNTER — Emergency Department: Payer: Self-pay

## 2022-04-29 ENCOUNTER — Emergency Department
Admission: EM | Admit: 2022-04-29 | Discharge: 2022-04-29 | Disposition: A | Discharge status: Home / Self Care | Payer: No Typology Code available for payment source | Attending: Emergency Medicine | Admitting: Emergency Medicine

## 2022-04-29 ENCOUNTER — Emergency Department: Payer: No Typology Code available for payment source

## 2022-04-29 ENCOUNTER — Other Ambulatory Visit: Payer: Self-pay

## 2022-04-29 ENCOUNTER — Encounter: Payer: Self-pay | Admitting: *Deleted

## 2022-04-29 DIAGNOSIS — X501XXA Overexertion from prolonged static or awkward postures, initial encounter: Secondary | ICD-10-CM | POA: Insufficient documentation

## 2022-04-29 DIAGNOSIS — Y990 Civilian activity done for income or pay: Secondary | ICD-10-CM | POA: Insufficient documentation

## 2022-04-29 DIAGNOSIS — S43005A Unspecified dislocation of left shoulder joint, initial encounter: Secondary | ICD-10-CM | POA: Insufficient documentation

## 2022-04-29 DIAGNOSIS — S4992XA Unspecified injury of left shoulder and upper arm, initial encounter: Secondary | ICD-10-CM | POA: Diagnosis present

## 2022-04-29 MED ORDER — FENTANYL CITRATE PF 50 MCG/ML IJ SOSY: 100.0000 ug | PREFILLED_SYRINGE | Freq: Once | INTRAMUSCULAR | Status: DC

## 2022-04-29 MED ORDER — FENTANYL CITRATE PF 50 MCG/ML IJ SOSY
100.0000 ug | PREFILLED_SYRINGE | Freq: Once | INTRAMUSCULAR | Status: AC
  Administered 2022-04-29: 100 ug via INTRAMUSCULAR
  Filled 2022-04-29: qty 2

## 2022-04-29 MED ORDER — MELOXICAM 15 MG PO TABS: 15.0000 mg | ORAL_TABLET | Freq: Every day | ORAL | 0 refills | Status: AC

## 2022-04-29 NOTE — ED Triage Notes (Addendum)
Pt has left shoulder pain.  Pt was reaching for something and arm popped out of place.   Limited rom   Hx dislocation of shoulder x 5.  Pt alert  speech clear. States WC

## 2022-04-29 NOTE — ED Provider Notes (Signed)
[facility] Provider Note  Patient Contact: 9:11 PM (approximate)   History   Shoulder Injury   HPI  Keith Rhodes is a 26 y.o. male who presents the emergency department complaining of left shoulder injury.  Patient states that he was working at [employer], reached under an item to pick up something and felt his shoulder give out.  Patient states that he has had 4 shoulder dislocations to the right shoulder, this will be the fifth to the left shoulder.  Patient had surgery on the right but is not had surgery on the left.  He was not lifting anything heavy.  States that he has deformity and inability to move the shoulder at this time.  No other injury or complaint.     Physical Exam   Triage Vital Signs: ED Triage Vitals  Enc Vitals Group     BP 04/29/22 2025 114/82     Pulse Rate 04/29/22 2028 70     Resp 04/29/22 2025 20     Temp 04/29/22 2025 97.6 F (36.4 C)     Temp Source 04/29/22 2025 Oral     SpO2 04/29/22 2025 97 %     Weight 04/29/22 2026 142 lb (64.4 kg)     Height 04/29/22 2026 5\' 10"  (1.778 m)     Head Circumference --      Peak Flow --      Pain Score 04/29/22 2025 10     Pain Loc --      Pain Edu? --      Excl. in GC? --     Most recent vital signs: Vitals:   04/29/22 2028 04/29/22 2338  BP:  114/82  Pulse: 70 70  Resp:  18  Temp:    SpO2:  99%     General: Alert and in no acute distress. Neck: No stridor. No cervical spine tenderness to palpation.  Cardiovascular:  Good peripheral perfusion Respiratory: Normal respiratory effort without tachypnea or retractions. Lungs CTAB. Musculoskeletal: Obvious deformity to the left shoulder consistent with shoulder dislocation.  No open wounds.  No range of motion currently.  Palpable abnormality consistent with likely anterior shoulder dislocation is felt with what appears to be humeral head perched anteriorly of its joint space.  No other palpable abnormality.  Examination of the  elbow, forearm and hand is unremarkable.  Pulses sensation intact distally. Neurologic:  No gross focal neurologic deficits are appreciated.  Skin:   No rash noted Other:   ED Results / Procedures / Treatments   Labs (all labs ordered are listed, but only abnormal results are displayed) Labs Reviewed - No data to display   EKG     RADIOLOGY  I personally viewed, evaluated, and interpreted these images as part of my medical decision making, as well as reviewing the written report by the radiologist.  ED Provider Interpretation: Anterior inferior dislocation of the left humeral head.  No associated fracture.  DG Shoulder Left Portable  Result Date: 04/29/2022 CLINICAL DATA:  Postreduction EXAM: LEFT SHOULDER COMPARISON:  Left shoulder x-ray earlier same day FINDINGS: There is no evidence of fracture or dislocation. There is no evidence of arthropathy or other focal bone abnormality. Soft tissues are unremarkable. IMPRESSION: Negative. Electronically Signed   By: 05/01/2022 M.D.   On: 04/29/2022 22:54   DG Shoulder Left  Result Date: 04/29/2022 CLINICAL DATA:  Arm popped out of place reaching for something; limited range of motion EXAM: LEFT SHOULDER - 2+  VIEW COMPARISON:  Radiographs 10/27/2020 FINDINGS: Anterior inferior dislocation of the humeral head in relation to the glenoid. No definite acute fracture. IMPRESSION: Anterior inferior dislocation of the left humeral head. Electronically Signed   By: Minerva Fester M.D.   On: 04/29/2022 20:49    PROCEDURES:  Critical Care performed: No  Reduction of dislocation  Date/Time: 04/30/2022 12:20 AM  Performed by: Racheal Patches, PA-C Authorized by: Racheal Patches, PA-C  Consent: Verbal consent obtained. Written consent obtained. Risks and benefits: risks, benefits and alternatives were discussed Consent given by: patient Patient understanding: patient states understanding of the procedure being  performed Imaging studies: imaging studies available Required items: required blood products, implants, devices, and special equipment available Patient identity confirmed: verbally with patient Time out: Immediately prior to procedure a "time out" was called to verify the correct patient, procedure, equipment, support staff and site/side marked as required. Local anesthesia used: no  Anesthesia: Local anesthesia used: no  Sedation: Patient sedated: no  Patient tolerance: patient tolerated the procedure well with no immediate complications Comments: Patient was placed in the prone position, left arm was over the side of the bed with traction.  After massage of the left shoulder, I was able to manipulate the scapula medially while performing external rotation of the patient's humerus with good palpable and visible reduction of the patient's shoulder.  Immediate movement was obtained.  Patient has good pulses sensation after the procedure.  Left shoulder is immobilized using sling.      MEDICATIONS ORDERED IN ED: Medications  fentaNYL (SUBLIMAZE) injection 100 mcg (100 mcg Intramuscular Given 04/29/22 2149)     IMPRESSION / MDM / ASSESSMENT AND PLAN / ED COURSE  I reviewed the triage vital signs and the nursing notes.                              Differential diagnosis includes, but is not limited to, shoulder dislocation, shoulder fracture, shoulder strain   Patient's presentation is most consistent with acute presentation with potential threat to life or bodily function.   Patient's diagnosis is consistent with shoulder dislocation.  Patient presents emergency department with an injury to the left shoulder.  He has had multiple shoulder dislocations to both shoulders with the right one being surgically repaired several years ago.  Patient had low mechanism of injury but had a dislocation identified on x-ray.  It was anterior and inferior.  Patient had reduction of the shoulder  using external rotation with scapular manipulation.  He was placed in a sling afterwards.  Follow-up with orthopedics as this is his fifth shoulder dislocation to the shoulder.  Anti-inflammatory for symptom relief.. Patient is given ED precautions to return to the ED for any worsening or new symptoms.        FINAL CLINICAL IMPRESSION(S) / ED DIAGNOSES   Final diagnoses:  Dislocation of left shoulder joint, initial encounter     Rx / DC Orders   ED Discharge Orders          Ordered    meloxicam (MOBIC) 15 MG tablet  Daily        04/29/22 2253             Note:  This document was prepared using Dragon voice recognition software and may include unintentional dictation errors.   Lanette Hampshire 04/30/22 0021    Concha Se, MD 05/01/22 [phone]

## 2022-04-29 NOTE — ED Notes (Signed)
E signature pad not working. Pt educated on discharge instructions and verbalized understanding.  

## 2024-06-14 ENCOUNTER — Encounter (HOSPITAL_COMMUNITY): Payer: Self-pay

## 2024-06-14 ENCOUNTER — Emergency Department (HOSPITAL_COMMUNITY)

## 2024-06-14 ENCOUNTER — Other Ambulatory Visit: Payer: Self-pay

## 2024-06-14 ENCOUNTER — Emergency Department (HOSPITAL_COMMUNITY)
Admission: EM | Admit: 2024-06-14 | Discharge: 2024-06-14 | Disposition: A | Discharge status: Home / Self Care | Attending: Emergency Medicine | Admitting: Emergency Medicine

## 2024-06-14 DIAGNOSIS — S43015A Anterior dislocation of left humerus, initial encounter: Secondary | ICD-10-CM | POA: Insufficient documentation

## 2024-06-14 DIAGNOSIS — Y93E1 Activity, personal bathing and showering: Secondary | ICD-10-CM | POA: Diagnosis not present

## 2024-06-14 DIAGNOSIS — Y92002 Bathroom of unspecified non-institutional (private) residence single-family (private) house as the place of occurrence of the external cause: Secondary | ICD-10-CM | POA: Insufficient documentation

## 2024-06-14 DIAGNOSIS — W01198A Fall on same level from slipping, tripping and stumbling with subsequent striking against other object, initial encounter: Secondary | ICD-10-CM | POA: Insufficient documentation

## 2024-06-14 DIAGNOSIS — S4992XA Unspecified injury of left shoulder and upper arm, initial encounter: Secondary | ICD-10-CM | POA: Diagnosis present

## 2024-06-14 DIAGNOSIS — S43005A Unspecified dislocation of left shoulder joint, initial encounter: Secondary | ICD-10-CM

## 2024-06-14 MED ORDER — FENTANYL CITRATE (PF) 50 MCG/ML IJ SOSY
50.0000 ug | PREFILLED_SYRINGE | Freq: Once | INTRAMUSCULAR | Status: AC
  Administered 2024-06-14: 50 ug via INTRAVENOUS
  Filled 2024-06-14: qty 1

## 2024-06-14 MED ORDER — PROPOFOL 10 MG/ML IV BOLUS
INTRAVENOUS | Status: AC | PRN
  Administered 2024-06-14 (×2): 36.75 mg via INTRAVENOUS

## 2024-06-14 MED ORDER — ONDANSETRON HCL 4 MG/2ML IJ SOLN
4.0000 mg | Freq: Once | INTRAMUSCULAR | Status: AC
  Administered 2024-06-14: 4 mg via INTRAVENOUS
  Filled 2024-06-14: qty 2

## 2024-06-14 MED ORDER — PROPOFOL 10 MG/ML IV BOLUS
0.5000 mg/kg | Freq: Once | INTRAVENOUS | Status: AC
  Administered 2024-06-14: 36.8 mg via INTRAVENOUS
  Filled 2024-06-14 (×2): qty 20

## 2024-06-14 NOTE — Progress Notes (Signed)
 Orthopedic Tech Progress Note Patient Details:  Keith Rhodes 1996/05/29 [id number]  Ortho Devices Type of Ortho Device: Shoulder immobilizer Ortho Device/Splint Location: left Ortho Device/Splint Interventions: Application, Adjustment   Post Interventions Patient Tolerated: Well Instructions Provided: Adjustment of device, Care of device  Waylan Thom Loving 06/14/2024, 4:30 PM

## 2024-06-14 NOTE — Discharge Instructions (Addendum)
 Be sure to follow-up with your orthopedic physician.  Return here for concerning changes in your condition.

## 2024-06-14 NOTE — Sedation Documentation (Signed)
 Reduction complete and Ortho splint placed

## 2024-06-14 NOTE — ED Triage Notes (Signed)
 Pt BIB EMS from home for left shoulder dislocation. Pt slipped out of the shower. Pt has a hx of dislocated shoulders and has had surgery on the left.   200mcg Fentanyl  given  126/78 86 hr 99% RA  22g LAC

## 2024-06-14 NOTE — ED Provider Notes (Signed)
 [place] EMERGENCY DEPARTMENT AT [facility] Provider Note   CSN: [id number] Arrival date & time: 06/14/24  1428     Patient presents with: Dislocation (/)   Keith Rhodes is a 28 y.o. male.   HPI Patient presents with shoulder pain.  He notes that he was in the shower, slipped, twisted his shoulder behind him, then fell striking the same shoulder.  No other injuries, no other complaints.  He notes a history of recurrent shoulder dislocations bilateral.  Right shoulder has had surgical repair.    Prior to Admission medications   Medication Sig Start Date End Date Taking? Authorizing Provider  ondansetron  (ZOFRAN  ODT) 4 MG disintegrating tablet Take 1 tablet (4 mg total) by mouth every 8 (eight) hours as needed for nausea or vomiting. Patient not taking: Reported on 10/27/2020 05/15/20   Hall-Potvin, Brittany, PA-C    Allergies: Patient has no known allergies.    Review of Systems  Updated Vital Signs BP 130/82   Pulse 67   Temp 98.4 F (36.9 C) (Oral)   Resp 19   Wt 73.5 kg   SpO2 99%   BMI 23.24 kg/m   Physical Exam Vitals and nursing note reviewed.  Constitutional:      General: He is not in acute distress.    Appearance: He is well-developed.  HENT:     Head: Normocephalic and atraumatic.  Eyes:     Conjunctiva/sclera: Conjunctivae normal.  Cardiovascular:     Rate and Rhythm: Normal rate and regular rhythm.     Pulses: Normal pulses.  Pulmonary:     Effort: Pulmonary effort is normal. No respiratory distress.     Breath sounds: No stridor.  Abdominal:     General: There is no distension.  Musculoskeletal:       Arms:  Skin:    General: Skin is warm and dry.  Neurological:     Mental Status: He is alert and oriented to person, place, and time.     (all labs ordered are listed, but only abnormal results are displayed) Labs Reviewed - No data to display  EKG: None  Radiology: DG Shoulder Left Portable Result Date:  06/14/2024 CLINICAL DATA:  Postreduction. EXAM: LEFT SHOULDER COMPARISON:  Earlier radiograph dated 06/14/2024. FINDINGS: Interval reduction of the previously seen dislocated shoulder, now in anatomic alignment. No acute fracture. The soft tissues are unremarkable. IMPRESSION: Successful interval reduction of the left shoulder Electronically Signed   By: Vanetta Chou M.D.   On: 06/14/2024 17:03   DG Shoulder Left Result Date: 06/14/2024 CLINICAL DATA:  Fall and trauma to the left shoulder. EXAM: DG SHOULDER 2+V*L* COMPARISON:  Left shoulder radiograph dated 04/29/2022. FINDINGS: There is anterior dislocation of the left humerus. No acute fracture. The bones are well mineralized. The soft tissues are unremarkable. IMPRESSION: Anterior dislocation of the left shoulder. Electronically Signed   By: Vanetta Chou M.D.   On: 06/14/2024 15:46     .Ortho Injury Treatment  Date/Time: 06/14/2024 4:31 PM  Performed by: Garrick Charleston, MD Authorized by: Garrick Charleston, MD   Consent:    Consent obtained:  Verbal   Consent given by:  Patient   Risks discussed:  Fracture, irreducible dislocation, recurrent dislocation, stiffness and restricted joint movement   Alternatives discussed:  Alternative treatment Universal protocol:    Procedure explained and questions answered to patient or proxy's satisfaction: yes     Relevant documents present and verified: yes     Test results available and  properly labeled: yes     Imaging studies available: yes     Required blood products, implants, devices, and special equipment available: yes     Site/side marked: yes     Immediately prior to procedure a time out was called: yes     Patient identity confirmed:  Verbally with patientInjury location: shoulder Location details: left shoulder Injury type: dislocation Dislocation type: anterior Hill-Sachs deformity: no Chronicity: recurrent Pre-procedure neurovascular assessment: neurovascularly  intact Pre-procedure distal perfusion: normal Pre-procedure neurological function: normal Pre-procedure range of motion: reduced  Anesthesia: Local anesthesia used: no  Patient sedated: Yes. Refer to sedation procedure documentation for details of sedation. Manipulation performed: yes Reduction method: external rotation Reduction successful: yes X-ray confirmed reduction: yes Immobilization: sling Splint Applied by: ED Provider and Ortho Tech Post-procedure distal perfusion: normal Post-procedure neurological function: normal Post-procedure range of motion: improved      Medications Ordered in the ED  fentaNYL  (SUBLIMAZE ) injection 50 mcg (50 mcg Intravenous Given 06/14/24 1456)  ondansetron  (ZOFRAN ) injection 4 mg (4 mg Intravenous Given 06/14/24 1510)  propofol  (DIPRIVAN ) 10 mg/mL bolus/IV push 36.8 mg (36.8 mg Intravenous Given by Other 06/14/24 1624)  propofol  (DIPRIVAN ) 10 mg/mL bolus/IV push (36.75 mg Intravenous Given 06/14/24 1624)                                    Medical Decision Making Patient presents with shoulder pain, and on exam and signs and symptoms consistent with fracture versus dislocation, latter more likely. Patient is distally neurovascular intact.  Amount and/or Complexity of Data Reviewed Radiology: ordered and independent interpretation performed. Decision-making details documented in ED Course.  Risk Prescription drug management. Decision regarding hospitalization.  X-ray consistent with dislocation.  I discussed risks and benefits of sedation, reduction the patient is amenable to the procedure.   5:06 PM Patient awakening from procedure no complications, shoulder has been reduced, x-ray consistent with this, patient discharged in stable condition.    Final diagnoses:  Dislocation of left shoulder joint, initial encounter      Garrick Charleston, MD 06/14/24 1706

## 2024-06-14 NOTE — Progress Notes (Signed)
 Respiratory Therapist at [facility]  in room number _10___ during procedure.  Suction with [place] at [facility] set up and ready to use. Ambu bag at [facility] and ready to use.  Patient placed on ETCO2 Nasal Cannula at ___4_ LPM.    Vitals at conclusion of procedure:  ETCO2 __43___ mmHg HR 58 RR 16 SPO2 100
